# Patient Record
Sex: Male | Born: 1945 | Race: White | Hispanic: No | Marital: Married | Smoking: Former smoker
Health system: Southern US, Community
[De-identification: ages and names within clinical notes are randomized; demographics above are authoritative.]

## PROBLEM LIST (undated history)

## (undated) DIAGNOSIS — C801 Malignant (primary) neoplasm, unspecified: Secondary | ICD-10-CM

## (undated) DIAGNOSIS — Z87442 Personal history of urinary calculi: Secondary | ICD-10-CM

## (undated) DIAGNOSIS — I10 Essential (primary) hypertension: Secondary | ICD-10-CM

## (undated) DIAGNOSIS — I2510 Atherosclerotic heart disease of native coronary artery without angina pectoris: Secondary | ICD-10-CM

## (undated) DIAGNOSIS — G4730 Sleep apnea, unspecified: Secondary | ICD-10-CM

## (undated) DIAGNOSIS — E119 Type 2 diabetes mellitus without complications: Secondary | ICD-10-CM

## (undated) DIAGNOSIS — C9110 Chronic lymphocytic leukemia of B-cell type not having achieved remission: Secondary | ICD-10-CM

## (undated) DIAGNOSIS — R011 Cardiac murmur, unspecified: Secondary | ICD-10-CM

## (undated) DIAGNOSIS — K2960 Other gastritis without bleeding: Secondary | ICD-10-CM

## (undated) DIAGNOSIS — I499 Cardiac arrhythmia, unspecified: Secondary | ICD-10-CM

## (undated) DIAGNOSIS — Z8719 Personal history of other diseases of the digestive system: Secondary | ICD-10-CM

## (undated) DIAGNOSIS — K2980 Duodenitis without bleeding: Secondary | ICD-10-CM

## (undated) DIAGNOSIS — M1990 Unspecified osteoarthritis, unspecified site: Secondary | ICD-10-CM

## (undated) DIAGNOSIS — K449 Diaphragmatic hernia without obstruction or gangrene: Secondary | ICD-10-CM

## (undated) HISTORY — DX: Unspecified osteoarthritis, unspecified site: M19.90

## (undated) HISTORY — DX: Essential (primary) hypertension: I10

## (undated) HISTORY — PX: ASD REPAIR: SHX258

## (undated) HISTORY — DX: Type 2 diabetes mellitus without complications: E11.9

## (undated) HISTORY — DX: Chronic lymphocytic leukemia of B-cell type not having achieved remission: C91.10

## (undated) HISTORY — DX: Atherosclerotic heart disease of native coronary artery without angina pectoris: I25.10

## (undated) HISTORY — DX: Malignant (primary) neoplasm, unspecified: C80.1

## (undated) HISTORY — PX: HERNIA REPAIR: SHX51

## (undated) HISTORY — PX: JOINT REPLACEMENT: SHX530

## (undated) HISTORY — DX: Personal history of other diseases of the digestive system: Z87.19

## (undated) HISTORY — DX: Cardiac murmur, unspecified: R01.1

## (undated) HISTORY — PX: CARDIAC ELECTROPHYSIOLOGY STUDY AND ABLATION: SHX1294

## (undated) HISTORY — PX: CORONARY ARTERY BYPASS GRAFT: SHX141

---

## 1998-11-02 ENCOUNTER — Encounter: Payer: Self-pay | Admitting: *Deleted

## 1998-11-02 ENCOUNTER — Ambulatory Visit (HOSPITAL_COMMUNITY): Admission: EM | Admit: 1998-11-02 | Discharge: 1998-11-02 | Payer: Self-pay | Admitting: Pediatrics

## 1999-05-17 ENCOUNTER — Inpatient Hospital Stay (HOSPITAL_COMMUNITY): Admission: EM | Admit: 1999-05-17 | Discharge: 1999-05-19 | Payer: Self-pay | Admitting: Pediatrics

## 1999-05-17 ENCOUNTER — Encounter: Payer: Self-pay | Admitting: *Deleted

## 1999-05-18 ENCOUNTER — Encounter: Payer: Self-pay | Admitting: *Deleted

## 1999-05-24 ENCOUNTER — Ambulatory Visit (HOSPITAL_COMMUNITY): Admission: RE | Admit: 1999-05-24 | Discharge: 1999-05-24 | Payer: Self-pay | Admitting: *Deleted

## 1999-05-24 ENCOUNTER — Encounter: Payer: Self-pay | Admitting: *Deleted

## 2004-07-04 ENCOUNTER — Other Ambulatory Visit: Payer: Self-pay

## 2004-07-11 ENCOUNTER — Ambulatory Visit: Payer: Self-pay | Admitting: Surgery

## 2009-08-19 ENCOUNTER — Ambulatory Visit: Payer: Self-pay | Admitting: Gastroenterology

## 2010-09-19 ENCOUNTER — Ambulatory Visit: Payer: Self-pay | Admitting: Internal Medicine

## 2010-09-20 ENCOUNTER — Ambulatory Visit: Payer: Self-pay | Admitting: Internal Medicine

## 2010-10-28 ENCOUNTER — Ambulatory Visit: Payer: Self-pay | Admitting: Internal Medicine

## 2010-10-30 ENCOUNTER — Ambulatory Visit: Payer: Self-pay | Admitting: Internal Medicine

## 2010-11-29 ENCOUNTER — Ambulatory Visit: Payer: Self-pay | Admitting: Internal Medicine

## 2010-12-01 ENCOUNTER — Ambulatory Visit: Payer: Self-pay | Admitting: Psychiatry

## 2010-12-02 ENCOUNTER — Ambulatory Visit: Payer: Self-pay | Admitting: Internal Medicine

## 2011-02-23 ENCOUNTER — Ambulatory Visit: Payer: Self-pay | Admitting: Internal Medicine

## 2011-03-01 ENCOUNTER — Ambulatory Visit: Payer: Self-pay | Admitting: Internal Medicine

## 2011-06-15 ENCOUNTER — Ambulatory Visit: Payer: Self-pay | Admitting: Internal Medicine

## 2011-07-01 ENCOUNTER — Ambulatory Visit: Payer: Self-pay | Admitting: Internal Medicine

## 2011-12-22 ENCOUNTER — Ambulatory Visit: Payer: Self-pay | Admitting: Internal Medicine

## 2011-12-22 LAB — CBC CANCER CENTER
Basophil #: 0 x10 3/mm (ref 0.0–0.1)
Basophil %: 0.3 %
HGB: 13.5 g/dL (ref 13.0–18.0)
Lymphocyte #: 3.6 x10 3/mm (ref 1.0–3.6)
MCH: 29.8 pg (ref 26.0–34.0)
MCHC: 32.5 g/dL (ref 32.0–36.0)
MCV: 92 fL (ref 80–100)
Monocyte #: 0.7 x10 3/mm (ref 0.2–1.0)
Neutrophil %: 47.5 %
Platelet: 187 x10 3/mm (ref 150–440)
RDW: 14.8 % — ABNORMAL HIGH (ref 11.5–14.5)

## 2011-12-22 LAB — COMPREHENSIVE METABOLIC PANEL
Albumin: 4.4 g/dL (ref 3.4–5.0)
Alkaline Phosphatase: 70 U/L (ref 50–136)
Anion Gap: 12 (ref 7–16)
BUN: 34 mg/dL — ABNORMAL HIGH (ref 7–18)
Bilirubin,Total: 0.4 mg/dL (ref 0.2–1.0)
Calcium, Total: 9.4 mg/dL (ref 8.5–10.1)
Creatinine: 1.1 mg/dL (ref 0.60–1.30)
EGFR (African American): >60
EGFR (Non-African Amer.): >60
Potassium: 4.1 mmol/L (ref 3.5–5.1)
SGOT(AST): 21 U/L (ref 15–37)
SGPT (ALT): 19 U/L

## 2011-12-30 ENCOUNTER — Ambulatory Visit: Payer: Self-pay | Admitting: Internal Medicine

## 2012-02-15 ENCOUNTER — Emergency Department: Payer: Self-pay | Admitting: Emergency Medicine

## 2012-11-28 ENCOUNTER — Ambulatory Visit: Payer: Self-pay | Admitting: Internal Medicine

## 2012-12-29 ENCOUNTER — Ambulatory Visit: Payer: Self-pay | Admitting: Internal Medicine

## 2013-01-07 LAB — CREATININE, SERUM: EGFR (African American): >60

## 2013-01-07 LAB — CBC CANCER CENTER
Basophil #: 0 x10 3/mm (ref 0.0–0.1)
Basophil %: 0.5 %
Eosinophil %: 1.8 %
HCT: 43.9 % (ref 40.0–52.0)
HGB: 15.1 g/dL (ref 13.0–18.0)
Lymphocyte #: 4.7 x10 3/mm — ABNORMAL HIGH (ref 1.0–3.6)
Lymphocyte %: 54.7 %
MCH: 30.6 pg (ref 26.0–34.0)
MCHC: 34.4 g/dL (ref 32.0–36.0)
Monocyte #: 0.6 x10 3/mm (ref 0.2–1.0)
Neutrophil %: 35.6 %
Platelet: 191 x10 3/mm (ref 150–440)
RBC: 4.94 10*6/uL (ref 4.40–5.90)
WBC: 8.7 x10 3/mm (ref 3.8–10.6)

## 2013-01-07 LAB — LACTATE DEHYDROGENASE: LDH: 166 U/L (ref 85–241)

## 2013-01-28 ENCOUNTER — Ambulatory Visit: Payer: Self-pay | Admitting: Internal Medicine

## 2014-01-06 ENCOUNTER — Ambulatory Visit: Payer: Self-pay | Admitting: Internal Medicine

## 2014-01-06 LAB — CBC CANCER CENTER
BASOS PCT: 1.1 %
Basophil #: 0.1 x10 3/mm (ref 0.0–0.1)
Eosinophil #: 0.2 x10 3/mm (ref 0.0–0.7)
Eosinophil %: 1.7 %
HCT: 45.4 % (ref 40.0–52.0)
HGB: 14.9 g/dL (ref 13.0–18.0)
Lymphocyte #: 4.6 x10 3/mm — ABNORMAL HIGH (ref 1.0–3.6)
Lymphocyte %: 47.1 %
MCH: 29.5 pg (ref 26.0–34.0)
MCHC: 32.9 g/dL (ref 32.0–36.0)
MCV: 90 fL (ref 80–100)
Monocyte #: 0.7 x10 3/mm (ref 0.2–1.0)
Monocyte %: 7.5 %
NEUTROS ABS: 4.2 x10 3/mm (ref 1.4–6.5)
Neutrophil %: 42.6 %
Platelet: 180 x10 3/mm (ref 150–440)
RBC: 5.06 10*6/uL (ref 4.40–5.90)
RDW: 15.4 % — AB (ref 11.5–14.5)
WBC: 9.8 x10 3/mm (ref 3.8–10.6)

## 2014-01-06 LAB — CREATININE, SERUM
CREATININE: 1.33 mg/dL — AB (ref 0.60–1.30)
EGFR (African American): >60
EGFR (Non-African Amer.): 54 — ABNORMAL LOW

## 2014-01-06 LAB — LACTATE DEHYDROGENASE: LDH: 173 U/L (ref 85–241)

## 2014-01-28 ENCOUNTER — Ambulatory Visit: Payer: Self-pay | Admitting: Internal Medicine

## 2014-10-06 ENCOUNTER — Ambulatory Visit: Payer: Self-pay | Admitting: Gastroenterology

## 2014-11-18 ENCOUNTER — Ambulatory Visit: Payer: Self-pay

## 2014-11-18 ENCOUNTER — Ambulatory Visit
Admit: 2014-11-18 | Disposition: A | Discharge status: Home / Self Care | Payer: Self-pay | Attending: General Practice | Admitting: General Practice

## 2014-11-18 LAB — BASIC METABOLIC PANEL
Anion Gap: 9 (ref 7–16)
BUN: 29 mg/dL — AB
CHLORIDE: 103 mmol/L
Calcium, Total: 9.4 mg/dL
Co2: 28 mmol/L
Creatinine: 1.13 mg/dL
EGFR (African American): >60
GLUCOSE: 120 mg/dL — AB
POTASSIUM: 4 mmol/L
SODIUM: 140 mmol/L

## 2014-11-18 LAB — URINALYSIS, COMPLETE
Bacteria: NONE SEEN
Bilirubin,UR: NEGATIVE
Blood: NEGATIVE
Glucose,UR: NEGATIVE mg/dL (ref 0–75)
Ketone: NEGATIVE
Leukocyte Esterase: NEGATIVE
Nitrite: NEGATIVE
PH: 5 (ref 4.5–8.0)
PROTEIN: NEGATIVE
SPECIFIC GRAVITY: 1.021 (ref 1.003–1.030)
SQUAMOUS EPITHELIAL: NONE SEEN

## 2014-11-18 LAB — SEDIMENTATION RATE: Erythrocyte Sed Rate: 17 mm/hr (ref 0–20)

## 2014-11-18 LAB — CBC
HCT: 43.7 % (ref 40.0–52.0)
HGB: 14.5 g/dL (ref 13.0–18.0)
MCH: 30.2 pg (ref 26.0–34.0)
MCHC: 33.2 g/dL (ref 32.0–36.0)
MCV: 91 fL (ref 80–100)
Platelet: 187 10*3/uL (ref 150–440)
RBC: 4.81 10*6/uL (ref 4.40–5.90)
RDW: 14.6 % — AB (ref 11.5–14.5)
WBC: 7.7 10*3/uL (ref 3.8–10.6)

## 2014-11-18 LAB — MRSA PCR SCREENING

## 2014-11-18 LAB — HEMOGLOBIN A1C: Hemoglobin A1C: 6 %

## 2014-11-18 LAB — PROTIME-INR
INR: 1
Prothrombin Time: 13.7 secs

## 2014-11-18 LAB — APTT: Activated PTT: 35.2 secs (ref 23.6–35.9)

## 2014-11-19 LAB — URINE CULTURE

## 2014-11-23 LAB — SURGICAL PATHOLOGY

## 2014-12-01 ENCOUNTER — Other Ambulatory Visit: Payer: Medicare Other | Admitting: Orthopedic Surgery

## 2014-12-01 DIAGNOSIS — Z96659 Presence of unspecified artificial knee joint: Secondary | ICD-10-CM

## 2014-12-01 MED ORDER — ACETAMINOPHEN 10 MG/ML IV SOLN
1000.0000 mg | Freq: Four times a day (QID) | INTRAVENOUS | Status: DC
  Administered 2014-12-02: 1000 mg via INTRAVENOUS
  Filled 2014-12-01: qty 100

## 2014-12-02 ENCOUNTER — Inpatient Hospital Stay: Payer: Medicare Other

## 2014-12-02 ENCOUNTER — Encounter: Payer: Self-pay | Admitting: *Deleted

## 2014-12-02 ENCOUNTER — Encounter
Admission: RE | Disposition: A | Discharge status: Home / Self Care | Payer: Self-pay | Source: Ambulatory Visit | Attending: Orthopedic Surgery

## 2014-12-02 ENCOUNTER — Inpatient Hospital Stay: Payer: Medicare Other | Admitting: Anesthesiology

## 2014-12-02 ENCOUNTER — Other Ambulatory Visit: Payer: Self-pay | Admitting: Orthopedic Surgery

## 2014-12-02 ENCOUNTER — Inpatient Hospital Stay
Admission: RE | Admit: 2014-12-02 | Discharge: 2014-12-05 | DRG: 470 | Disposition: A | Discharge status: Home / Self Care | Payer: Medicare Other | Source: Ambulatory Visit | Attending: Orthopedic Surgery | Admitting: Orthopedic Surgery

## 2014-12-02 DIAGNOSIS — Z7982 Long term (current) use of aspirin: Secondary | ICD-10-CM | POA: Diagnosis not present

## 2014-12-02 DIAGNOSIS — Z87891 Personal history of nicotine dependence: Secondary | ICD-10-CM | POA: Diagnosis not present

## 2014-12-02 DIAGNOSIS — Z96652 Presence of left artificial knee joint: Secondary | ICD-10-CM

## 2014-12-02 DIAGNOSIS — I10 Essential (primary) hypertension: Secondary | ICD-10-CM | POA: Diagnosis present

## 2014-12-02 DIAGNOSIS — I4891 Unspecified atrial fibrillation: Secondary | ICD-10-CM | POA: Diagnosis present

## 2014-12-02 DIAGNOSIS — Z79899 Other long term (current) drug therapy: Secondary | ICD-10-CM | POA: Diagnosis not present

## 2014-12-02 DIAGNOSIS — K5790 Diverticulosis of intestine, part unspecified, without perforation or abscess without bleeding: Secondary | ICD-10-CM | POA: Diagnosis present

## 2014-12-02 DIAGNOSIS — Z96659 Presence of unspecified artificial knee joint: Secondary | ICD-10-CM

## 2014-12-02 DIAGNOSIS — Z86010 Personal history of colonic polyps: Secondary | ICD-10-CM | POA: Diagnosis not present

## 2014-12-02 DIAGNOSIS — Z800 Family history of malignant neoplasm of digestive organs: Secondary | ICD-10-CM

## 2014-12-02 DIAGNOSIS — Z856 Personal history of leukemia: Secondary | ICD-10-CM | POA: Diagnosis not present

## 2014-12-02 DIAGNOSIS — Z8249 Family history of ischemic heart disease and other diseases of the circulatory system: Secondary | ICD-10-CM | POA: Diagnosis not present

## 2014-12-02 DIAGNOSIS — Z87442 Personal history of urinary calculi: Secondary | ICD-10-CM | POA: Diagnosis not present

## 2014-12-02 DIAGNOSIS — M109 Gout, unspecified: Secondary | ICD-10-CM | POA: Diagnosis present

## 2014-12-02 DIAGNOSIS — I2510 Atherosclerotic heart disease of native coronary artery without angina pectoris: Secondary | ICD-10-CM | POA: Diagnosis present

## 2014-12-02 DIAGNOSIS — E119 Type 2 diabetes mellitus without complications: Secondary | ICD-10-CM | POA: Diagnosis present

## 2014-12-02 DIAGNOSIS — K449 Diaphragmatic hernia without obstruction or gangrene: Secondary | ICD-10-CM | POA: Diagnosis not present

## 2014-12-02 DIAGNOSIS — M1712 Unilateral primary osteoarthritis, left knee: Principal | ICD-10-CM | POA: Diagnosis present

## 2014-12-02 DIAGNOSIS — R011 Cardiac murmur, unspecified: Secondary | ICD-10-CM | POA: Diagnosis not present

## 2014-12-02 DIAGNOSIS — K2960 Other gastritis without bleeding: Secondary | ICD-10-CM | POA: Diagnosis not present

## 2014-12-02 HISTORY — PX: TOTAL KNEE ARTHROPLASTY: SHX125

## 2014-12-02 LAB — BASIC METABOLIC PANEL
Anion gap: 5 (ref 5–15)
BUN: 22 mg/dL — AB (ref 6–20)
CHLORIDE: 109 mmol/L (ref 101–111)
CO2: 25 mmol/L (ref 22–32)
Calcium: 8.1 mg/dL — ABNORMAL LOW (ref 8.9–10.3)
Creatinine, Ser: 0.96 mg/dL (ref 0.61–1.24)
GFR calc Af Amer: >60 mL/min (ref >60)
GLUCOSE: 122 mg/dL — AB (ref 65–99)
Potassium: 4 mmol/L (ref 3.5–5.1)
SODIUM: 139 mmol/L (ref 135–145)

## 2014-12-02 LAB — CBC
HCT: 36.2 % — ABNORMAL LOW (ref 40.0–52.0)
Hemoglobin: 12 g/dL — ABNORMAL LOW (ref 13.0–18.0)
MCH: 30.6 pg (ref 26.0–34.0)
MCHC: 33.2 g/dL (ref 32.0–36.0)
MCV: 92.1 fL (ref 80.0–100.0)
Platelets: 128 10*3/uL — ABNORMAL LOW (ref 150–440)
RBC: 3.94 MIL/uL — ABNORMAL LOW (ref 4.40–5.90)
RDW: 15.1 % — ABNORMAL HIGH (ref 11.5–14.5)
WBC: 8.6 10*3/uL (ref 3.8–10.6)

## 2014-12-02 LAB — GLUCOSE, CAPILLARY
GLUCOSE-CAPILLARY: 106 mg/dL — AB (ref 70–99)
Glucose-Capillary: 114 mg/dL — ABNORMAL HIGH (ref 70–99)
Glucose-Capillary: 186 mg/dL — ABNORMAL HIGH (ref 70–99)

## 2014-12-02 LAB — HEMOGLOBIN A1C: Hgb A1c MFr Bld: 6.1 % — ABNORMAL HIGH (ref 4.0–6.0)

## 2014-12-02 SURGERY — ARTHROPLASTY, KNEE, TOTAL
Anesthesia: Spinal | Laterality: Left

## 2014-12-02 MED ORDER — BISACODYL 10 MG RE SUPP
10.0000 mg | Freq: Every day | RECTAL | Status: DC | PRN
  Administered 2014-12-04: 10 mg via RECTAL
  Filled 2014-12-02: qty 1

## 2014-12-02 MED ORDER — OXYCODONE HCL 5 MG PO TABS
5.0000 mg | ORAL_TABLET | ORAL | Status: DC | PRN
  Administered 2014-12-02 – 2014-12-05 (×13): 10 mg via ORAL
  Filled 2014-12-02 (×13): qty 2

## 2014-12-02 MED ORDER — ACETAMINOPHEN 10 MG/ML IV SOLN
1000.0000 mg | Freq: Four times a day (QID) | INTRAVENOUS | Status: AC
  Administered 2014-12-02 – 2014-12-03 (×4): 1000 mg via INTRAVENOUS
  Filled 2014-12-02 (×4): qty 100

## 2014-12-02 MED ORDER — ALLOPURINOL 100 MG PO TABS
100.0000 mg | ORAL_TABLET | Freq: Every day | ORAL | Status: DC
  Administered 2014-12-03 – 2014-12-05 (×3): 100 mg via ORAL
  Filled 2014-12-02 (×4): qty 1

## 2014-12-02 MED ORDER — ACETAMINOPHEN 325 MG PO TABS: 650.0000 mg | ORAL_TABLET | Freq: Four times a day (QID) | ORAL | Status: DC | PRN

## 2014-12-02 MED ORDER — ACETAMINOPHEN 650 MG RE SUPP: 650.0000 mg | Freq: Four times a day (QID) | RECTAL | Status: DC | PRN

## 2014-12-02 MED ORDER — CEFAZOLIN SODIUM-DEXTROSE 2-3 GM-% IV SOLR: 2.0000 g | INTRAVENOUS | Status: DC

## 2014-12-02 MED ORDER — METOCLOPRAMIDE HCL 10 MG PO TABS
10.0000 mg | ORAL_TABLET | Freq: Three times a day (TID) | ORAL | Status: DC
  Administered 2014-12-02 – 2014-12-05 (×11): 10 mg via ORAL
  Filled 2014-12-02 (×11): qty 1

## 2014-12-02 MED ORDER — PANTOPRAZOLE SODIUM 40 MG PO TBEC
40.0000 mg | DELAYED_RELEASE_TABLET | Freq: Two times a day (BID) | ORAL | Status: DC
  Administered 2014-12-02 – 2014-12-05 (×7): 40 mg via ORAL
  Filled 2014-12-02 (×7): qty 1

## 2014-12-02 MED ORDER — GLYCOPYRROLATE 0.2 MG/ML IJ SOLN
INTRAMUSCULAR | Status: AC
  Filled 2014-12-02: qty 1

## 2014-12-02 MED ORDER — MAGNESIUM HYDROXIDE 400 MG/5ML PO SUSP
30.0000 mL | Freq: Every day | ORAL | Status: DC | PRN
  Administered 2014-12-04 – 2014-12-05 (×2): 30 mL via ORAL
  Filled 2014-12-02 (×3): qty 30

## 2014-12-02 MED ORDER — CEFAZOLIN SODIUM-DEXTROSE 2-3 GM-% IV SOLR
INTRAVENOUS | Status: AC
  Administered 2014-12-02: 2 g via INTRAVENOUS
  Filled 2014-12-02: qty 50

## 2014-12-02 MED ORDER — FENTANYL CITRATE (PF) 100 MCG/2ML IJ SOLN
INTRAMUSCULAR | Status: DC | PRN
  Administered 2014-12-02 (×2): 50 ug via INTRAVENOUS

## 2014-12-02 MED ORDER — FENTANYL CITRATE (PF) 100 MCG/2ML IJ SOLN
INTRAMUSCULAR | Status: AC
  Filled 2014-12-02: qty 2

## 2014-12-02 MED ORDER — FAMOTIDINE 20 MG PO TABS
20.0000 mg | ORAL_TABLET | Freq: Once | ORAL | Status: AC
  Administered 2014-12-02: 20 mg via ORAL

## 2014-12-02 MED ORDER — VANCOMYCIN HCL IN DEXTROSE 1-5 GM/200ML-% IV SOLN
1000.0000 mg | INTRAVENOUS | Status: AC
  Administered 2014-12-02: 1000 mg via INTRAVENOUS
  Filled 2014-12-02: qty 200

## 2014-12-02 MED ORDER — SENNA 8.6 MG PO TABS
1.0000 | ORAL_TABLET | Freq: Two times a day (BID) | ORAL | Status: DC
  Administered 2014-12-02 – 2014-12-04 (×5): 8.6 mg via ORAL
  Filled 2014-12-02 (×5): qty 1

## 2014-12-02 MED ORDER — FERROUS SULFATE 325 (65 FE) MG PO TABS
325.0000 mg | ORAL_TABLET | Freq: Two times a day (BID) | ORAL | Status: DC
  Administered 2014-12-03 – 2014-12-05 (×5): 325 mg via ORAL
  Filled 2014-12-02 (×6): qty 1

## 2014-12-02 MED ORDER — FENTANYL CITRATE (PF) 100 MCG/2ML IJ SOLN
25.0000 ug | INTRAMUSCULAR | Status: DC | PRN
  Administered 2014-12-02: 25 ug via INTRAVENOUS

## 2014-12-02 MED ORDER — ONDANSETRON HCL 4 MG/2ML IJ SOLN
4.0000 mg | Freq: Four times a day (QID) | INTRAMUSCULAR | Status: DC | PRN
  Administered 2014-12-03: 4 mg via INTRAVENOUS
  Filled 2014-12-02 (×2): qty 2

## 2014-12-02 MED ORDER — ONDANSETRON HCL 4 MG/2ML IJ SOLN: 4.0000 mg | Freq: Once | INTRAMUSCULAR | Status: DC | PRN

## 2014-12-02 MED ORDER — EPHEDRINE SULFATE 50 MG/ML IJ SOLN
INTRAMUSCULAR | Status: DC | PRN
  Administered 2014-12-02: 10 mg via INTRAVENOUS

## 2014-12-02 MED ORDER — FLEET ENEMA 7-19 GM/118ML RE ENEM: 1.0000 | ENEMA | Freq: Once | RECTAL | Status: AC | PRN

## 2014-12-02 MED ORDER — BUPIVACAINE HCL (PF) 0.5 % IJ SOLN
INTRAMUSCULAR | Status: DC | PRN
  Administered 2014-12-02: 3 mL

## 2014-12-02 MED ORDER — PROPOFOL INFUSION 10 MG/ML OPTIME
INTRAVENOUS | Status: DC | PRN
  Administered 2014-12-02: 100 ug/kg/min via INTRAVENOUS

## 2014-12-02 MED ORDER — BUPIVACAINE-EPINEPHRINE (PF) 0.25% -1:200000 IJ SOLN
INTRAMUSCULAR | Status: AC
  Filled 2014-12-02: qty 30

## 2014-12-02 MED ORDER — METFORMIN HCL 500 MG PO TABS
1000.0000 mg | ORAL_TABLET | Freq: Two times a day (BID) | ORAL | Status: DC
  Administered 2014-12-03: 1000 mg via ORAL
  Filled 2014-12-02 (×2): qty 2

## 2014-12-02 MED ORDER — BUPIVACAINE-EPINEPHRINE (PF) 0.5% -1:200000 IJ SOLN
INTRAMUSCULAR | Status: DC | PRN
  Administered 2014-12-02: 30 mL

## 2014-12-02 MED ORDER — MORPHINE SULFATE 2 MG/ML IJ SOLN
2.0000 mg | INTRAMUSCULAR | Status: DC | PRN
  Administered 2014-12-02: 2 mg via INTRAVENOUS
  Filled 2014-12-02: qty 1

## 2014-12-02 MED ORDER — PNEUMOCOCCAL VAC POLYVALENT 25 MCG/0.5ML IJ INJ
0.5000 mL | INJECTION | INTRAMUSCULAR | Status: AC
  Administered 2014-12-03: 0.5 mL via INTRAMUSCULAR
  Filled 2014-12-02: qty 0.5

## 2014-12-02 MED ORDER — SODIUM CHLORIDE 0.9 % IJ SOLN
INTRAMUSCULAR | Status: AC
  Filled 2014-12-02: qty 50

## 2014-12-02 MED ORDER — GLYCOPYRROLATE 0.2 MG/ML IJ SOLN
0.2000 mg | Freq: Once | INTRAMUSCULAR | Status: AC
  Administered 2014-12-02: 0.2 mg via INTRAVENOUS

## 2014-12-02 MED ORDER — ALUM & MAG HYDROXIDE-SIMETH 200-200-20 MG/5ML PO SUSP: 30.0000 mL | ORAL | Status: DC | PRN

## 2014-12-02 MED ORDER — SODIUM CHLORIDE 0.9 % IV SOLN
100.0000 mL | INTRAVENOUS | Status: DC
  Administered 2014-12-02: 100 mL via INTRAVENOUS
  Administered 2014-12-03: 1000 mL via INTRAVENOUS

## 2014-12-02 MED ORDER — MENTHOL 3 MG MT LOZG: 1.0000 | LOZENGE | OROMUCOSAL | Status: DC | PRN

## 2014-12-02 MED ORDER — ONDANSETRON HCL 4 MG PO TABS
4.0000 mg | ORAL_TABLET | Freq: Four times a day (QID) | ORAL | Status: DC | PRN
  Administered 2014-12-03 – 2014-12-04 (×3): 4 mg via ORAL
  Filled 2014-12-02 (×3): qty 1

## 2014-12-02 MED ORDER — SODIUM CHLORIDE 0.9 % IV SOLN
INTRAVENOUS | Status: DC
  Administered 2014-12-02 (×4): via INTRAVENOUS

## 2014-12-02 MED ORDER — INSULIN ASPART 100 UNIT/ML ~~LOC~~ SOLN
0.0000 [IU] | Freq: Three times a day (TID) | SUBCUTANEOUS | Status: DC
  Administered 2014-12-03: 3 [IU] via SUBCUTANEOUS
  Filled 2014-12-02 (×2): qty 3

## 2014-12-02 MED ORDER — ACETAMINOPHEN 10 MG/ML IV SOLN
INTRAVENOUS | Status: AC
  Filled 2014-12-02: qty 100

## 2014-12-02 MED ORDER — ENOXAPARIN SODIUM 30 MG/0.3ML ~~LOC~~ SOLN
30.0000 mg | Freq: Two times a day (BID) | SUBCUTANEOUS | Status: DC
  Administered 2014-12-03 – 2014-12-05 (×5): 30 mg via SUBCUTANEOUS
  Filled 2014-12-02 (×6): qty 0.3

## 2014-12-02 MED ORDER — GEMFIBROZIL 600 MG PO TABS
600.0000 mg | ORAL_TABLET | Freq: Two times a day (BID) | ORAL | Status: DC
  Administered 2014-12-03: 600 mg via ORAL
  Filled 2014-12-02 (×3): qty 1

## 2014-12-02 MED ORDER — CEFAZOLIN SODIUM-DEXTROSE 2-3 GM-% IV SOLR
2.0000 g | Freq: Four times a day (QID) | INTRAVENOUS | Status: AC
  Administered 2014-12-02 – 2014-12-03 (×4): 2 g via INTRAVENOUS
  Filled 2014-12-02 (×4): qty 50

## 2014-12-02 MED ORDER — CELECOXIB 200 MG PO CAPS
200.0000 mg | ORAL_CAPSULE | Freq: Two times a day (BID) | ORAL | Status: DC
  Administered 2014-12-02 – 2014-12-05 (×6): 200 mg via ORAL
  Filled 2014-12-02 (×6): qty 1

## 2014-12-02 MED ORDER — PHENOL 1.4 % MT LIQD: 1.0000 | OROMUCOSAL | Status: DC | PRN

## 2014-12-02 MED ORDER — SODIUM CHLORIDE 0.9 % IV SOLN
INTRAVENOUS | Status: DC | PRN
  Administered 2014-12-02: 60 mL

## 2014-12-02 MED ORDER — NEOMYCIN-POLYMYXIN B GU 40-200000 IR SOLN
Status: DC | PRN
  Administered 2014-12-02: 16 mL

## 2014-12-02 MED ORDER — FAMOTIDINE 20 MG PO TABS
ORAL_TABLET | ORAL | Status: AC
  Filled 2014-12-02: qty 1

## 2014-12-02 MED ORDER — HYDROCHLOROTHIAZIDE 25 MG PO TABS
25.0000 mg | ORAL_TABLET | Freq: Every day | ORAL | Status: DC
  Administered 2014-12-03 – 2014-12-05 (×3): 25 mg via ORAL
  Filled 2014-12-02 (×4): qty 1

## 2014-12-02 MED ORDER — MIDAZOLAM HCL 2 MG/2ML IJ SOLN
INTRAMUSCULAR | Status: DC | PRN
  Administered 2014-12-02: 1 mg via INTRAVENOUS

## 2014-12-02 MED ORDER — METOPROLOL SUCCINATE ER 50 MG PO TB24
50.0000 mg | ORAL_TABLET | Freq: Every day | ORAL | Status: DC
  Administered 2014-12-03 – 2014-12-05 (×3): 50 mg via ORAL
  Filled 2014-12-02 (×3): qty 1

## 2014-12-02 MED ORDER — CHLORHEXIDINE GLUCONATE 4 % EX LIQD: 60.0000 mL | Freq: Once | CUTANEOUS | Status: DC

## 2014-12-02 SURGICAL SUPPLY — 56 items
AUTOTRANSFUS HAS 1/8 (MISCELLANEOUS) ×2
BATTERY INSTRU NAVIGATION (MISCELLANEOUS) ×8 IMPLANT
BLADE SAW 1 (BLADE) ×2 IMPLANT
BLADE SAW 1/2 (BLADE) ×2 IMPLANT
BONE CEMENT GENTAMICIN (Cement) ×4 IMPLANT
CANISTER SUCT 1200ML W/VALVE (MISCELLANEOUS) ×2 IMPLANT
CANISTER SUCT 3000ML (MISCELLANEOUS) ×4 IMPLANT
CAP KNEE TOTAL 3 SIGMA ×2 IMPLANT
CATH TRAY 16F METER LATEX (MISCELLANEOUS) ×2 IMPLANT
CEMENT BONE GENTAMICIN 40 (Cement) ×2 IMPLANT
COOLER POLAR GLACIER W/PUMP (MISCELLANEOUS) ×2 IMPLANT
DRAPE SHEET LG 3/4 BI-LAMINATE (DRAPES) ×2 IMPLANT
DRSG DERMACEA 8X12 NADH (GAUZE/BANDAGES/DRESSINGS) ×2 IMPLANT
DRSG OPSITE POSTOP 4X12 (GAUZE/BANDAGES/DRESSINGS) ×2 IMPLANT
DRSG OPSITE POSTOP 4X14 (GAUZE/BANDAGES/DRESSINGS) ×2 IMPLANT
DURAPREP 26ML APPLICATOR (WOUND CARE) ×4 IMPLANT
EX-PIN ORTHOLOCK NAV 4X150 (PIN) ×4 IMPLANT
GLOVE BIOGEL M STRL SZ7.5 (GLOVE) ×4 IMPLANT
GLOVE INDICATOR 8.0 STRL GRN (GLOVE) ×2 IMPLANT
GLOVE SURG 9.0 ORTHO LTXF (GLOVE) ×2 IMPLANT
GLOVE SURG ORTHO 9.0 STRL STRW (GLOVE) ×2 IMPLANT
GOWN STRL REUS W/ TWL LRG LVL3 (GOWN DISPOSABLE) ×1 IMPLANT
GOWN STRL REUS W/TWL 2XL LVL3 (GOWN DISPOSABLE) ×2 IMPLANT
GOWN STRL REUS W/TWL LRG LVL3 (GOWN DISPOSABLE) ×1
GOWN STRL REUS W/TWL XL LVL4 (GOWN DISPOSABLE) ×2 IMPLANT
HANDPIECE SUCTION TUBG SURGILV (MISCELLANEOUS) ×2 IMPLANT
HOLDER FOLEY CATH W/STRAP (MISCELLANEOUS) ×2 IMPLANT
HOOD PEEL AWAY FACE SHEILD DIS (HOOD) ×4 IMPLANT
KNIFE SCULPS 14X20 (INSTRUMENTS) ×2 IMPLANT
NDL SAFETY 18GX1.5 (NEEDLE) ×2 IMPLANT
NEEDLE SPNL 18GX3.5 QUINCKE PK (NEEDLE) ×2 IMPLANT
NEEDLE SPNL 20GX3.5 QUINCKE YW (NEEDLE) ×2 IMPLANT
NS IRRIG 500ML POUR BTL (IV SOLUTION) ×2 IMPLANT
PACK TOTAL KNEE (MISCELLANEOUS) ×2 IMPLANT
PAD GROUND ADULT SPLIT (MISCELLANEOUS) ×2 IMPLANT
PAD WRAPON POLAR KNEE (MISCELLANEOUS) ×1 IMPLANT
PIN FIXATION 1/8DIA X 3INL (PIN) ×2 IMPLANT
SOL .9 NS 3000ML IRR  AL (IV SOLUTION) ×1
SOL .9 NS 3000ML IRR UROMATIC (IV SOLUTION) ×1 IMPLANT
SOL PREP PVP 2OZ (MISCELLANEOUS) ×2
SOLUTION PREP PVP 2OZ (MISCELLANEOUS) ×1 IMPLANT
SPONGE DRAIN TRACH 4X4 STRL 2S (GAUZE/BANDAGES/DRESSINGS) ×2 IMPLANT
STAPLER SKIN PROX 35W (STAPLE) ×2 IMPLANT
STRAP SAFETY BODY (MISCELLANEOUS) ×2 IMPLANT
SUCTION FRAZIER TIP 10 FR DISP (SUCTIONS) ×2 IMPLANT
SUT VIC AB 0 CT1 36 (SUTURE) ×2 IMPLANT
SUT VIC AB 1 CT1 36 (SUTURE) ×4 IMPLANT
SUT VIC AB 2-0 CT2 27 (SUTURE) ×2 IMPLANT
SYR 20CC LL (SYRINGE) ×2 IMPLANT
SYR 30ML LL (SYRINGE) ×2 IMPLANT
SYR 50ML LL SCALE MARK (SYRINGE) ×2 IMPLANT
SYSTEM AUTOTRANSFUS DUAL TROCR (MISCELLANEOUS) ×1 IMPLANT
TOWEL OR 17X26 4PK STRL BLUE (TOWEL DISPOSABLE) ×2 IMPLANT
TOWER CARTRIDGE SMART MIX (DISPOSABLE) ×2 IMPLANT
WATER STERILE IRR 1000ML POUR (IV SOLUTION) ×2 IMPLANT
WRAPON POLAR PAD KNEE (MISCELLANEOUS) ×2

## 2014-12-02 NOTE — Brief Op Note (Signed)
12/02/2014  11:01 AM  PATIENT:  Diona Foley  69 y.o. male  PRE-OPERATIVE DIAGNOSIS:  Degenerative arthrosis of knee-primary  POST-OPERATIVE DIAGNOSIS:  Degenerative arthrosis of knee  PROCEDURE:  Procedure(s): TOTAL KNEE ARTHROPLASTY- computer assisted (Left)  SURGEON:  Surgeon(s) and Role:    * Dereck Leep, MD - Primary  PHYSICIAN ASSISTANT: Vance Peper, PA   ANESTHESIA:   spinal  EBL:  Total I/O In: 2000 [I.V.:2000] Out: 225 [Urine:200; Blood:25]  BLOOD ADMINISTERED:none  DRAINS: 2 medium drains to reinfusion system   LOCAL MEDICATIONS USED:  MARCAINE with epi   and Amount: 30 ml  SPECIMEN:  No Specimen  DISPOSITION OF SPECIMEN:  N/A  COUNTS:  YES  TOURNIQUET:   Total Tourniquet Time Documented: Thigh (Left) - 83 minutes Total: Thigh (Left) - 83 minutes   DICTATION: .Viviann Spare Dictation  PLAN OF CARE: Admit to inpatient   PATIENT DISPOSITION:  PACU - hemodynamically stable.   Delay start of Pharmacological VTE agent (>24hrs) due to surgical blood loss or risk of bleeding: yes

## 2014-12-02 NOTE — Op Note (Signed)
DATE OF SURGERY:  12/02/2014  PATIENT NAME:  Colin Gutierrez   DOB: 1946-01-20  MRN: [id number]  PRE-OPERATIVE DIAGNOSIS: Degenerative arthrosis of the left knee, primary  POST-OPERATIVE DIAGNOSIS:  Same  PROCEDURE:  Left total knee arthroplasty using computer-assisted navigation  SURGEON:  Marciano Sequin. M.D.  ASSISTANT:  Vance Peper, PA (present and scrubbed throughout the case, critical for assistance with exposure, retraction, instrumentation, and closure)  ANESTHESIA: Spinal   ESTIMATED BLOOD LOSS: 25 mL  FLUIDS REPLACED: 2000 mL of crystalloid  TOURNIQUET TIME: 83 minutes  DRAINS: 2 medium drains to a reinfusion system  SOFT TISSUE RELEASES: Anterior cruciate ligament, posterior cruciate ligament, deep and superficial medial collateral ligament, patellofemoral ligament   IMPLANTS UTILIZED: DePuy PFC Sigma size 3 posterior stabilized femoral component (cemented), size 4 MBT tibial component (cemented), 38 mm 3 peg oval dome patella (cemented), and a 15 mm stabilized rotating platform polyethylene insert.  INDICATIONS FOR SURGERY: Colin Gutierrez is a 69 y.o. year old male with a long history of progressive knee pain. X-rays demonstrated severe degenerative changes in tricompartmental fashion. He had not seen any significant improvement despite conservative nonsurgical intervention. After discussion of the risks and benefits of surgical intervention, the patient expressed understanding of the risks benefits and agree with plans for total knee arthroplasty.   The risks, benefits, and alternatives were discussed at length including but not limited to the risks of infection, bleeding, nerve injury, stiffness, blood clots, the need for revision surgery, cardiopulmonary complications, among others, and they were willing to proceed.  PROCEDURE IN DETAIL: The patient was brought into the operating room and, after adequate spinal anesthesia was achieved, a tourniquet was placed on the  patient's upper thigh. The patient's knee and leg were cleaned and prepped with alcohol and DuraPrep and draped in the usual sterile fashion. A "timeout" was performed as per usual protocol. The lower extremity was exsanguinated using an Esmarch, and the tourniquet was inflated to 300 mmHg. An anterior longitudinal incision was made followed by a standard mid vastus approach. The deep fibers of the medial collateral ligament were elevated in a subperiosteal fashion off of the medial flare of the tibia so as to maintain a continuous soft tissue sleeve. The patella was subluxed laterally and the patellofemoral ligament was incised. Inspection of the knee demonstrated severe degenerative changes with full-thickness loss of articular cartilage. Osteophytes were debrided using a rongeur. Anterior and posterior cruciate ligaments were excised. Two 4.0 mm Schanz pins were inserted in the femur and into the tibia for attachment of the array of trackers used for computer-assisted navigation. Hip center was identified using a circumduction technique. Distal landmarks were mapped using the computer. The distal femur and proximal tibia were mapped using the computer. The distal femoral cutting guide was positioned using computer-assisted navigation so as to achieve a 5 distal valgus cut. The femur was sized and it was felt that a size 3 femoral component was appropriate. A size 3 femoral cutting guide was positioned and the anterior cut was performed and verified using the computer. This was followed by completion of the posterior and chamfer cuts. Femoral cutting guide for the central box was then positioned in the center box cut was performed.  Attention was then directed to the proximal tibia. Medial and lateral menisci were excised. The extramedullary tibial cutting guide was positioned using computer-assisted navigation so as to achieve a 0 varus-valgus alignment and 0 posterior slope. The cut was performed and  verified using  the computer. The proximal tibia was sized and it was felt that a size 4 tibial tray was appropriate. Tibial and femoral trials were inserted followed by insertion of a 12.5 mm polyethylene insert. The knee was felt to be tight medially. A Cobb elevator was used to elevate the superficial fibers of the medial collateral ligament and a 15 mm polyethylene was inserted.This allowed for excellent mediolateral soft tissue balancing both in flexion and in full extension. Finally, the patella was cut and repaired so as to accommodate a 38 mm 3 peg oval dome patella. A patella trial was placed and the knee was placed through a range of motion with excellent patellar tracking appreciated. The femoral trial was removed after debridement of posterior osteophytes. The central post-hole for the tibial component was reamed followed by insertion of a keel punch. Tibial trials were then removed. Cut surfaces of bone were irrigated with copious amounts of normal saline with antibiotic solution using pulsatile lavage and then suctioned dry. Polymethylmethacrylate cement was prepared in the usual fashion using a vacuum mixer. Cement was applied to the cut surface of the proximal tibia as well as along the undersurface of a size 4 MBT tibial component. Tibial component was positioned and impacted into place. Excess cement was removed using [employer] fast streamer. Cement was then applied to the cut surfaces of the femur as well as along the posterior flanges of the size 3 femoral component the femoral component was positioned and impacted in place. Excess cement was removed using [employer] fast streamer. A 15 mm polyethylene trial was inserted and the knee was brought into full extension with steady axial compression applied. Finally, cement was applied to the backside of a 38 mm 3 peg oval dome patella and the patellar component was positioned and patellar clamp applied. Excess cement was removed using [employer] fast streamer. After adequate  curing of the cement, the tourniquet was deflated after a total tourniquet time of 83 minutes. Hemostasis was achieved using electrocautery. The knee was irrigated with copious amounts of normal saline with antibiotic solution using pulsatile lavage and then suctioned dry. 20 mL of 1.3% Exparel in 40 mL of normal saline was injected along the posterior capsule, medial and lateral gutters, and along the arthrotomy site. A 15 mm stabilized rotating platform polyethylene insert was inserted and the knee was placed through a range of motion with excellent mediolateral soft tissue balancing appreciated and excellent patellar tracking noted. 2 medium drains were placed in the wound bed and brought out through separate stab incisions to be attached to a reinfusion system. The medial parapatellar portion of the incision was reapproximated using interrupted sutures of #1 Vicryl. Subcutaneous tissue was then injected with a total of 30 cc of 0.25% Marcaine with epinephrine. Subcutaneous tissue was approximated in layers using first #0 Vicryl followed #2-0 Vicryl. The skin was approximated with skin staples. A sterile dressing was applied.  The patient tolerated the procedure well and was transported to the recovery room in stable condition.    Rana Adorno P. Holley Bouche., M.D.

## 2014-12-02 NOTE — Anesthesia Postprocedure Evaluation (Signed)
  Anesthesia Post-op Note  Patient: Colin Gutierrez  Procedure(s) Performed: Procedure(s): TOTAL KNEE ARTHROPLASTY- computer assisted (Left)  Anesthesia type:Spinal  Patient location: PACU  Post pain: Pain level controlled  Post assessment: Post-op Vital signs reviewed, Patient's Cardiovascular Status Stable, Respiratory Function Stable, Patent Airway and No signs of Nausea or vomiting  Post vital signs: Reviewed and stable  Last Vitals:  Filed Vitals:   12/02/14 1452  BP:   Pulse: 53  Temp:   Resp:     Level of consciousness: awake, alert  and patient cooperative  Complications: No apparent anesthesia complications

## 2014-12-02 NOTE — Transfer of Care (Signed)
Immediate Anesthesia Transfer of Care Note  Patient: Colin Gutierrez  Procedure(s) Performed: Procedure(s): TOTAL KNEE ARTHROPLASTY- computer assisted (Left)  Patient Location: PACU  Anesthesia Type:Spinal  Level of Consciousness: awake and alert   Airway & Oxygen Therapy: Patient Spontanous Breathing  Post-op Assessment: Report given to RN  Post vital signs: Reviewed and stable  Last Vitals:  Filed Vitals:   12/02/14 1103  BP: 104/64  Pulse:   Temp: 36.3 C  Resp: 10    Complications: No apparent anesthesia complications

## 2014-12-02 NOTE — Anesthesia Preprocedure Evaluation (Addendum)
Anesthesia Evaluation  Patient identified by MRN, date of birth, ID band Patient awake    Airway Mallampati: II  TM Distance: >3 FB Neck ROM: full    Dental  (+) Chipped,    Pulmonary former smoker,          Cardiovascular hypertension,     Neuro/Psych    GI/Hepatic hiatal hernia,   Endo/Other  diabetes  Renal/GU      Musculoskeletal   Abdominal   Peds  Hematology   Anesthesia Other Findings   Reproductive/Obstetrics                           Anesthesia Physical Anesthesia Plan  ASA: II  Anesthesia Plan: Spinal   Post-op Pain Management:    Induction:   Airway Management Planned: Nasal Cannula  Additional Equipment:   Intra-op Plan:   Post-operative Plan:   Informed Consent: I have reviewed the patients History and Physical, chart, labs and discussed the procedure including the risks, benefits and alternatives for the proposed anesthesia with the patient or authorized representative who has indicated his/her understanding and acceptance.     Plan Discussed with: CRNA and Surgeon  Anesthesia Plan Comments:         Anesthesia Quick Evaluation

## 2014-12-02 NOTE — Anesthesia Procedure Notes (Signed)
Spinal Patient location during procedure: OR Start time: 12/02/2014 7:45 AM End time: 12/02/2014 7:51 AM Reason for block: at surgeon's request Staffing Anesthesiologist: Gunnar Bulla Resident/CRNA: Binyamin Nelis, [place] Performed by: anesthesiologist and resident/CRNA  Preanesthetic Checklist Completed: patient identified, site marked, surgical consent, pre-op evaluation, timeout performed, IV checked, risks and benefits discussed, monitors and equipment checked and at surgeon's request Spinal Block Patient position: sitting Prep: Betadine Patient monitoring: heart rate, cardiac monitor, continuous pulse ox and blood pressure Approach: midline Location: L3-4 Injection technique: single-shot Needle Needle type: Whitacre  Needle gauge: 24 G Needle length: 9 cm Needle insertion depth: 6 cm Assessment Sensory level: T10

## 2014-12-02 NOTE — H&P (Signed)
The patient has been re-examined, and the chart reviewed, and there have been no interval changes to the documented history and physical.    The risks, benefits, and alternatives have been discussed at length, and the patient is willing to proceed.   

## 2014-12-02 NOTE — Progress Notes (Signed)
Pt reports that he is having right sided/substernal chest pain. Unable to describe the pain, pain in not reproducible with palpation or a deep breath. Pain does not radiate. No nausea, SOB. Dr. Marcello Moores called, came to bedside to see pt. Heart rate in 40s, has been in 50s and high 40s since arriving to PACU and in and out of a-fib. Has hx of a-fib. BP 105/72. Order received for 0.2 mf of robinul.

## 2014-12-02 NOTE — Outcomes Assessment (Signed)
Pt arrived to floor via stretcher s/p left total knee. Spinal started off at L3 shortly after full sensation noted. reinfused 450 ml of blood. Oxy for pain, alert and oriented, isolation for positive nasal swap. Pulse runs low otherwise vitals are normal.

## 2014-12-03 DIAGNOSIS — M1712 Unilateral primary osteoarthritis, left knee: Secondary | ICD-10-CM | POA: Diagnosis not present

## 2014-12-03 LAB — GLUCOSE, CAPILLARY
GLUCOSE-CAPILLARY: 127 mg/dL — AB (ref 70–99)
GLUCOSE-CAPILLARY: 105 mg/dL — AB (ref 70–99)
GLUCOSE-CAPILLARY: 113 mg/dL — AB (ref 70–99)
Glucose-Capillary: 158 mg/dL — ABNORMAL HIGH (ref 70–99)

## 2014-12-03 LAB — CBC
HCT: 32.9 % — ABNORMAL LOW (ref 40.0–52.0)
Hemoglobin: 11.1 g/dL — ABNORMAL LOW (ref 13.0–18.0)
MCH: 31 pg (ref 26.0–34.0)
MCHC: 33.7 g/dL (ref 32.0–36.0)
MCV: 92.2 fL (ref 80.0–100.0)
Platelets: 106 10*3/uL — ABNORMAL LOW (ref 150–440)
RBC: 3.57 MIL/uL — AB (ref 4.40–5.90)
RDW: 15.2 % — AB (ref 11.5–14.5)
WBC: 7.4 10*3/uL (ref 3.8–10.6)

## 2014-12-03 MED ORDER — METFORMIN HCL 500 MG PO TABS
1000.0000 mg | ORAL_TABLET | Freq: Every day | ORAL | Status: DC
  Administered 2014-12-04 – 2014-12-05 (×2): 1000 mg via ORAL
  Filled 2014-12-03 (×2): qty 2

## 2014-12-03 MED ORDER — FERROUS SULFATE 325 (65 FE) MG PO TABS: 325.0000 mg | ORAL_TABLET | Freq: Two times a day (BID) | ORAL | Status: DC

## 2014-12-03 MED ORDER — ENOXAPARIN SODIUM 30 MG/0.3ML ~~LOC~~ SOLN: 40.0000 mg | SUBCUTANEOUS | Status: DC

## 2014-12-03 MED ORDER — OXYCODONE HCL 5 MG PO TABS: 5.0000 mg | ORAL_TABLET | ORAL | Status: DC | PRN

## 2014-12-03 MED ORDER — GEMFIBROZIL 600 MG PO TABS
600.0000 mg | ORAL_TABLET | Freq: Every morning | ORAL | Status: DC
  Administered 2014-12-04 – 2014-12-05 (×2): 600 mg via ORAL
  Filled 2014-12-03 (×2): qty 1

## 2014-12-03 NOTE — Outcomes Assessment (Signed)
Pt has met goals for po day knee replacement. Worked well with therapy NV intact, has voided. Pain controlled with oxycodone.

## 2014-12-03 NOTE — Care Management Note (Signed)
Case Management Note  Patient Details  Name: Colin Gutierrez MRN: [id number] Date of Birth: [birth year]  Subjective/Objective:                  Patient resting in bed comfortably waiting to work with PT. Patient plans to return home at discharge and states his son and grandson will provide support in the home along with transportation. He states he has a rolling walker available at home and I have asked that he request that to be brought to hospital for evaluation. He would like to use [facility] at discharge. [phone] for Rx.   Action/Plan: Referral sent to [facility] via fax and message sent to [facility] with Ar[facility] Go of this referral. I attempted to [facility] in [facility]mg  [facility] but no answer.   Expected Discharge Date:                  Expected Discharge Plan:  [place]  In-House Referral:     Discharge planning Services  CM Consult  Post Acute Care Choice:  Home Health Choice offered to:  Patient  DME Arranged:  N/A DME Agency:     HH Arranged:  PT HH Agency:  [place]  Status of Service:  In process, will continue to follow  Medicare Important Message Given:    Date Medicare IM Given:    Medicare IM give by:    Date Additional Medicare IM Given:    Additional Medicare Important Message give by:     If discussed at [place] of Stay Meetings, dates discussed:    Additional Comments:  Marshell Garfinkel, RN 12/03/2014, 10:51 AM

## 2014-12-03 NOTE — Evaluation (Signed)
Physical Therapy Evaluation Patient Details Name: Colin Gutierrez MRN: [id number] DOB: Mar 16, 1946 Today's Date: 12/03/2014   History of Present Illness  Pt is a 69yo white male who reports chronic history of L knee degerative joint disease. Pt elected to undergo TKA on 5/4.   Clinical Impression  Pt presents with impairment of strength, range of motion, and pain, limiting ability to perform ADL, IADL, and mobility at prior level of function, and will benefit from skilled intervention to resolve the above impairments and limitations and restore to PLOF.     Follow Up Recommendations Home health PT;Supervision - Intermittent    Equipment Recommendations       Recommendations for Other Services       Precautions / Restrictions Precautions Precautions: Fall Required Braces or Orthoses:  (Pt able to perform straight leg raise. ) Restrictions Weight Bearing Restrictions: No      Mobility  Bed Mobility Overal bed mobility: Modified Independent                Transfers Overall transfer level: Needs assistance Equipment used: Rolling walker (2 wheeled) Transfers: Sit to/from Stand Sit to Stand: Min guard         General transfer comment: Verbal cues for safe AD use.   Ambulation/Gait Ambulation/Gait assistance: Min guard Ambulation Distance (Feet): 50 Feet Assistive device: Rolling walker (2 wheeled) Gait Pattern/deviations: Antalgic;Decreased weight shift to left     General Gait Details: Pt attempting to WBAT on LLE, given cues for TKE in midstance.  Stairs            Wheelchair Mobility    Modified Rankin (Stroke Patients Only)       Balance Overall balance assessment: Modified Independent                                           Pertinent Vitals/Pain Pain Assessment: 0-10 Pain Score: 3  Pain Descriptors / Indicators: Sore Pain Intervention(s): Monitored during session;RN gave pain meds during session;Ice applied    Home  Living Family/patient expects to be discharged to:: Private residence Living Arrangements: Alone Available Help at Discharge: Family (Grandson) Type of Home: House Home Access: Stairs to enter Entrance Stairs-Rails: None Entrance Stairs-Number of Steps: 1 Home Layout: One level Home Equipment: Environmental consultant - 2 wheels;Toilet riser      Prior Function Level of Independence: Independent               Hand Dominance        Extremity/Trunk Assessment   Upper Extremity Assessment: Overall WFL for tasks assessed           Lower Extremity Assessment: Overall WFL for tasks assessed      Cervical / Trunk Assessment: Normal  Communication   Communication: No difficulties  Cognition Arousal/Alertness: Awake/alert Behavior During Therapy: WFL for tasks assessed/performed Overall Cognitive Status: Within Functional Limits for tasks assessed                      General Comments      Exercises Total Joint Exercises Ankle Circles/Pumps: AROM;Seated;Both;15 reps Quad Sets: AROM;Seated;15 reps;Left Heel Slides: AROM;Seated;15 reps;Left Hip ABduction/ADduction: AROM;Seated;Left;15 reps Goniometric ROM: L Knee: 12-65 degrees      Assessment/Plan    PT Assessment    PT Diagnosis Difficulty walking;Abnormality of gait;Generalized weakness   PT Problem List    PT Treatment Interventions  PT Goals (Current goals can be found in the Care Plan section) Acute Rehab PT Goals Patient Stated Goal: Pt expecting to DC to home with assistance from grandson  PT Goal Formulation: With patient Time For Goal Achievement: 12/17/14 Potential to Achieve Goals: Good    Frequency     Barriers to discharge        Co-evaluation               End of Session Equipment Utilized During Treatment: Gait belt Activity Tolerance: Patient tolerated treatment well;No increased pain Patient left: in chair;with call bell/phone within reach;with chair alarm set Nurse  Communication: Mobility status         Time: 9211-9417 PT Time Calculation (min) (ACUTE ONLY): 39 min   Charges:   PT Evaluation $Initial PT Evaluation Tier I: 1 Procedure PT Treatments $Therapeutic Exercise: 23-37 mins   PT G Codes:        Camille Thau C 01-02-2015, 10:30 AM   Etta Grandchild, PT, DPT, BM 10:31 AM  Jan 02, 2015

## 2014-12-03 NOTE — Progress Notes (Signed)
Clinical Social Worker (CSW) received SNF consult. PT is recommending home health. RN Case Manager aware of above. Please reconsult if future social work needs arise. CSW signing off.   Colin Gutierrez, LCSWA [phone] 

## 2014-12-03 NOTE — Care Management Note (Signed)
Case Management Note  Patient Details  Name: Colin Gutierrez MRN: [id number] Date of Birth: 08-23-1945  I was able to [facility] [phone] of Rx Lovenox 40mg  #14.  Marshell Garfinkel, RN 12/03/2014, 11:32 AM

## 2014-12-03 NOTE — Outcomes Assessment (Signed)
VSS, patient is A+O with no signs of distress. Pain is controlled with current meds. Mild n/ with no emesis. IV fluids infusing with antibiotics.  Foley d/c'd this am.  av1's and teds in place. Polar care in place and hemovac patent.  LLE is dry clean and intact.

## 2014-12-03 NOTE — Progress Notes (Signed)
   Subjective: 1 Day Post-Op Procedure(s) (LRB): TOTAL KNEE ARTHROPLASTY- computer assisted (Left) Patient reports pain as 2 on 0-10 scale.   Patient is well, and has had no acute complaints or problems We will start therapy today.  Plan is to go Home after hospital stay. Patient did report some nausea earlier this morning no longer having any nausea and no vomiting Patient denies any chest pains or shortness of breath. Objective: Vital signs in last 24 hours: Temp:  [97.3 F (36.3 C)-98.3 F (36.8 C)] 98.3 F (36.8 C) (05/05 0534) Pulse Rate:  [38-87] 59 (05/05 0534) Resp:  [10-18] 18 (05/05 0534) BP: (104-141)/(60-81) 112/60 mmHg (05/05 0534) SpO2:  [91 %-100 %] 98 % (05/05 0534) Patient still has original dressing on and subsequently incision cannot be evaluated Heels are non tender and elevated off the bed using rolled towels Intake/Output from previous day: 05/04 0701 - 05/05 0700 In: 4303 [I.V.:3290; Blood:913; IV Piggyback:100] Out: 5597 [Urine:1600; Drains:110; Blood:25] Intake/Output this shift: Total I/O In: -  Out: 910 [Urine:800; Drains:110]   Recent Labs  12/02/14 1330 12/03/14 0423  HGB 12.0* 11.1*    Recent Labs  12/02/14 1330 12/03/14 0423  WBC 8.6 7.4  RBC 3.94* 3.57*  HCT 36.2* 32.9*  PLT 128* 106*    Recent Labs  12/02/14 1330  NA 139  K 4.0  CL 109  CO2 25  BUN 22*  CREATININE 0.96  GLUCOSE 122*  CALCIUM 8.1*   No results for input(s): LABPT, INR in the last 72 hours.  EXAM General - Patient is Alert, Appropriate and Oriented Extremity - Neurologically intact ABD soft Neurovascular intact Sensation intact distally Intact pulses distally Dorsiflexion/Plantar flexion intact Dressing - dressing C/D/I Motor Function - intact, moving foot and toes well on exam. Calves are nontender. no evidence of any  Past Medical History  Diagnosis Date  . Coronary artery disease   . Hypertension   . Heart murmur   . Diabetes mellitus  without complication   . History of hiatal hernia   . Arthritis   . Cancer     leukemia    Assessment/Plan: 1 Day Post-Op Procedure(s) (LRB): TOTAL KNEE ARTHROPLASTY- computer assisted (Left) Active Problems:   Total knee replacement status  Estimated body mass index is 27.98 kg/(m^2) as calculated from the following:   Height as of this encounter: 6\' 1"  (1.854 m).   Weight as of this encounter: 96.163 kg (212 lb). Advance diet Up with therapy D/C IV fluids when tolerating fluids well  Labs: Chemistry profile and hemoglobin tomorrow morning DVT Prophylaxis - Lovenox, Foot Pumps and TED hose Weight-Bearing as tolerated to left leg D/C O2 and Pulse OX and try on Room [employer] [place] 12/03/2014, 6:56 AM

## 2014-12-03 NOTE — Evaluation (Signed)
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 Pt elected to undergo TKA on 5/4.    Clinical Impression   Pt is 69 year old male s/p L TKA who lives at home alone and has a grandson who is going to live with him while he works part time.  He was working part time at [employer] and is currently limited in functional ADLs due to pain, decreased ROM.  He requires minimal assist for sit to stand and transfers during ADLs and for LB dressing without use of AD.  He was independent in ADLs prior to surgery and would benefit from continued OT for education in assistive devices, functional mobility, and education for caregivers.     Follow Up Recommendations  No OT follow up    Equipment Recommendations  Tub/shower seat    Recommendations for Other Services PT consult     Precautions / Restrictions Precautions Precautions: Fall Restrictions Weight Bearing Restrictions: No      Mobility Bed Mobility Overal bed mobility: Modified Independent                Transfers Overall transfer level: Needs assistance Equipment used: Rolling walker (2 wheeled) Transfers: Sit to/from Stand Sit to Stand: Min guard         General transfer comment: Verbal cues for safe AD use.     Balance Overall balance assessment: Modified Independent                                          ADL Overall ADL's : Needs assistance/impaired Eating/Feeding: Independent   Grooming: Wash/dry hands;Wash/dry face;Oral care;Brushing hair;Independent   Upper Body Bathing: Independent   Lower Body Bathing: Independent   Upper Body Dressing : Independent   Lower Body Dressing: Minimal assistance   Toilet Transfer: Minimal assistance Toilet Transfer Details (indicate cue type and  reason):  (using FWW) Toileting- Clothing Manipulation and Hygiene: Minimal assistance       Functional mobility during ADLs: Minimal assistance General ADL Comments:  (Pt requires minimal assistance for pain and decreaed ROM of LE for dressing and fatigue during functional mobility)     Vision     Perception     Praxis      Pertinent Vitals/Pain Pain Assessment: 0-10 Pain Score: 1  Pain Location: L knee Pain Descriptors / Indicators: Aching Pain Intervention(s): Premedicated before session;Ice applied     Hand Dominance     Extremity/Trunk Assessment             Communication     Cognition Arousal/Alertness: Awake/alert Behavior During Therapy: WFL for tasks assessed/performed Overall Cognitive Status: Within Functional Limits for tasks assessed                     General Comments       Exercises       Shoulder Instructions      Home Living                                          Prior Functioning/Environment  OT Diagnosis: Generalized weakness;Acute pain   OT Problem List: Decreased strength;Decreased range of motion   OT Treatment/Interventions: Self-care/ADL training;DME and/or AE instruction    OT Goals(Current goals can be found in the care plan section) Acute Rehab OT Goals Patient Stated Goal: Pt expecting to DC to home with assistance from grandson  OT Goal Formulation: With patient Time For Goal Achievement: 12/17/14 Potential to Achieve Goals: Good  OT Frequency: Min 1X/week   Barriers to D/C:    none noted at this time       Co-evaluation              End of Session Equipment Utilized During Treatment: Gait belt  Activity Tolerance: Patient tolerated treatment well Patient left: in bed;with call bell/phone within reach;with bed alarm set   Time: 1400-1425 OT Time Calculation (min): 25 min Charges:  OT General Charges $OT Visit: 1 Procedure OT Treatments $Self Care/Home  Management : 8-22 mins G-Codes:    Wofford,Susan Dec 11, 2014, 2:40 PM    Chrys Racer, OTR/L

## 2014-12-03 NOTE — Progress Notes (Signed)
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 Pt elected to undergo TKA on 5/4.     PT Comments    Pt  is showing progress as evidenced by increased ambulation distance with less c/o pain. Pt should continue to work toward impairments of ROM and strength in LLE. Pt presenting with impaired strength and ROM, impairing indep in ADL, IADL, and AMB, and will continue to benefit from skilled intervention to restore to PLOF>   Follow Up Recommendations  Home health PT;Supervision - Intermittent     Equipment Recommendations       Recommendations for Other Services       Precautions / Restrictions Precautions Precautions: Fall Required Braces or Orthoses:  (Pt able to perform straight leg raise. ) Restrictions Weight Bearing Restrictions: No    Mobility  Bed Mobility Overal bed mobility: Modified Independent                Transfers Overall transfer level: Needs assistance Equipment used: Rolling walker (2 wheeled) Transfers: Sit to/from Stand Sit to Stand: Min guard         General transfer comment: Verbal cues for safe AD use.   Ambulation/Gait Ambulation/Gait assistance: Min guard Ambulation Distance (Feet): 150 Feet Assistive device: Rolling walker (2 wheeled) Gait Pattern/deviations: Antalgic;Decreased weight shift to left     General Gait Details: Pt attempting to WBAT on LLE, given cues for TKE in midstance.   Stairs            Wheelchair Mobility    Modified Rankin (Stroke Patients Only)       Balance Overall balance assessment: Modified Independent                                  Cognition Arousal/Alertness: Awake/alert Behavior During Therapy: WFL for tasks assessed/performed Overall Cognitive Status: Within Functional Limits for  tasks assessed                      Exercises Total Joint Exercises Ankle Circles/Pumps: AROM;Seated;Both;15 reps Quad Sets: AROM;Seated;15 reps;Left Heel Slides: AROM;Seated;15 reps;Left Hip ABduction/ADduction: AROM;Seated;Left;15 reps Straight Leg Raises: AROM;Seated;Left;15 reps Goniometric ROM: L Knee: 8-80 degrees    General Comments        Pertinent Vitals/Pain Pain Assessment: 0-10 Pain Score: 3  Pain Descriptors / Indicators: Sore Pain Intervention(s): Monitored during session;RN gave pain meds during session;Ice applied    Home Living Family/patient expects to be discharged to:: Private residence Living Arrangements: Alone Available Help at Discharge: Family (Grandson) Type of Home: House Home Access: Stairs to enter Entrance Stairs-Rails: None Home Layout: One level Home Equipment: Environmental consultant - 2 wheels;Toilet riser      Prior Function Level of Independence: Independent          PT Goals (current goals can now be found in the care plan section) Acute Rehab PT Goals Patient Stated Goal: Pt expecting to DC to home with assistance from grandson  PT Goal Formulation: With patient Time For Goal Achievement: 12/17/14 Potential to Achieve Goals: Good Progress towards PT goals: Progressing toward goals    Frequency  BID    PT Plan Current plan remains appropriate    Co-evaluation  End of Session Equipment Utilized During Treatment: Gait belt Activity Tolerance: Patient tolerated treatment well;No increased pain Patient left: with call bell/phone within reach;with chair alarm set;in bed     Time: 9276-3943 PT Time Calculation (min) (ACUTE ONLY): 27 min  Charges:  $Gait Training: 8-22 mins $Therapeutic Exercise: 23-37 mins                    G Codes:      Buccola,Allan C Dec 27, 2014, 1:54 PM Etta Grandchild, PT, DPT, BM

## 2014-12-03 NOTE — Plan of Care (Signed)
Problem: Consults Goal: Diagnosis- Total Joint Replacement Outcome: Progressing Primary Total Knee     

## 2014-12-04 LAB — GLUCOSE, CAPILLARY
GLUCOSE-CAPILLARY: 117 mg/dL — AB (ref 70–99)
GLUCOSE-CAPILLARY: 117 mg/dL — AB (ref 70–99)
Glucose-Capillary: 106 mg/dL — ABNORMAL HIGH (ref 70–99)
Glucose-Capillary: 134 mg/dL — ABNORMAL HIGH (ref 70–99)

## 2014-12-04 LAB — CBC
HEMATOCRIT: 34.7 % — AB (ref 40.0–52.0)
HEMOGLOBIN: 11.6 g/dL — AB (ref 13.0–18.0)
MCH: 30.5 pg (ref 26.0–34.0)
MCHC: 33.3 g/dL (ref 32.0–36.0)
MCV: 91.5 fL (ref 80.0–100.0)
Platelets: 102 10*3/uL — ABNORMAL LOW (ref 150–440)
RBC: 3.79 MIL/uL — ABNORMAL LOW (ref 4.40–5.90)
RDW: 15.2 % — ABNORMAL HIGH (ref 11.5–14.5)
WBC: 7.7 10*3/uL (ref 3.8–10.6)

## 2014-12-04 NOTE — Plan of Care (Signed)
Problem: Consults Goal: Diagnosis- Total Joint Replacement Outcome: Progressing Primary Total Knee     

## 2014-12-04 NOTE — Outcomes Assessment (Signed)
Cont outcome:  Milk of mag administered in am

## 2014-12-04 NOTE — Care Management Note (Signed)
Case Management Note  Patient Details  Name: Colin Gutierrez MRN: [id number] Date of Birth: 09-05-1945  Lovenox $95.16; patient aware and agrees. Marshell Garfinkel, RN 12/04/2014, 2:56 PM

## 2014-12-04 NOTE — OR Nursing (Signed)
Patient is taught how to give himself lovenox injection. Significant orders at the bedside, lovenox patient fact sheet given to patient. Patient verbalized he was comfortable with given himself injections. Call the pharmacy for a lovenox kit and the department was out of stock.

## 2014-12-04 NOTE — Progress Notes (Signed)
   Subjective: 2 Days Post-Op Procedure(s) (LRB): TOTAL KNEE ARTHROPLASTY- computer assisted (Left) Patient reports pain as 2 on 0-10 scale.   Patient is well, and has had no acute complaints or problems We will start therapy today.  Plan is to go Home after hospital stay. no nausea and no vomiting Patient denies any chest pains or shortness of breath. Objective: Vital signs in last 24 hours: Temp:  [97.3 F (36.3 C)-97.9 F (36.6 C)] 97.4 F (36.3 C) (05/06 0741) Pulse Rate:  [55-58] 58 (05/06 0741) Resp:  [17-18] 17 (05/06 0741) BP: (102-132)/(58-72) 110/70 mmHg (05/06 0741) SpO2:  [93 %-97 %] 94 % (05/06 0504) well approximated incision Heels are non tender and elevated off the bed using rolled towels Intake/Output from previous day: 05/05 0701 - 05/06 0700 In: 2115 [P.O.:1140; I.V.:975] Out: 1587 [Urine:1360; Drains:227] Intake/Output this shift:     Recent Labs  12/02/14 1330 12/03/14 0423 12/04/14 0437  HGB 12.0* 11.1* 11.6*    Recent Labs  12/03/14 0423 12/04/14 0437  WBC 7.4 7.7  RBC 3.57* 3.79*  HCT 32.9* 34.7*  PLT 106* 102*    Recent Labs  12/02/14 1330  NA 139  K 4.0  CL 109  CO2 25  BUN 22*  CREATININE 0.96  GLUCOSE 122*  CALCIUM 8.1*   No results for input(s): LABPT, INR in the last 72 hours.  EXAM General - Patient is Alert, Appropriate and Oriented Extremity - Neurologically intact ABD soft Neurovascular intact Sensation intact distally Intact pulses distally Dorsiflexion/Plantar flexion intact Dressing - dressing C/D/I Motor Function - intact, moving foot and toes well on exam.   Past Medical History  Diagnosis Date  . Coronary artery disease   . Hypertension   . Heart murmur   . Diabetes mellitus without complication   . History of hiatal hernia   . Arthritis   . Cancer     leukemia    Assessment/Plan: 2 Days Post-Op Procedure(s) (LRB): TOTAL KNEE ARTHROPLASTY- computer assisted (Left) Active Problems:   Total  knee replacement status  Estimated body mass index is 27.98 kg/(m^2) as calculated from the following:   Height as of this encounter: 6\' 1"  (1.854 m).   Weight as of this encounter: 96.163 kg (212 lb). Up with therapy  Labs: None DVT Prophylaxis - Lovenox, Foot Pumps and TED hose Weight-Bearing as tolerated to left leg D/C O2 and Pulse OX and try on Room [employer] [facility] [place] 12/04/2014, 8:22 AM

## 2014-12-04 NOTE — Discharge Summary (Signed)
Physician Discharge Summary  Patient ID: Colin Gutierrez MRN: [id number] DOB/AGE: [birth year] 71 y.o.  Admit date: 12/02/2014 Discharge date: 12/04/2014  Admission Diagnoses:  OA   Discharge Diagnoses: Patient Active Problem List   Diagnosis Date Noted  . Total knee replacement status 12/01/2014    Past Medical History  Diagnosis Date  . Coronary artery disease   . Hypertension   . Heart murmur   . Diabetes mellitus without complication   . History of hiatal hernia   . Arthritis   . Cancer     leukemia     Transfusion: Patient did receive Autovac transfusion the first 6 hours postoperatively   Consultants (if any):    Discharged Condition: Improved  Hospital Course: Colin Gutierrez is an 69 y.o. male who was admitted 12/02/2014 with a diagnosis of degenerative arthrosis of left knee and went to the operating room on 12/02/2014 and underwent the above named procedures.    Surgeries: Procedure(s): TOTAL KNEE ARTHROPLASTY- computer assisted on 12/02/2014 Patient tolerated the surgery well. Taken to PACU where she was stabilized and then transferred to the orthopedic floor.  Started on Lovenox 30 mg q 12 hrs. Foot pumps applied bilaterally at 80 mmHg. Heels elevated on bed with rolled towels. No evidence of DVT. Negative Homan. Physical therapy started on day #1 for gait training and transfer. OT started day #1 for ADL and assisted devices.  Patient's IV , foley and hemovac was d/c on day #2.  ANESTHESIA: Spinal  ESTIMATED BLOOD LOSS: 25 mL  FLUIDS REPLACED: 2000 mL of crystalloid  TOURNIQUET TIME: 83 minutes  DRAINS: 2 medium drains to a reinfusion system  SOFT TISSUE RELEASES: Anterior cruciate ligament, posterior cruciate ligament, deep and superficial medial collateral ligament, patellofemoral ligament   IMPLANTS UTILIZED: DePuy PFC Sigma size 3 posterior stabilized femoral component (cemented), size 4 MBT tibial component (cemented), 38 mm 3 peg oval dome patella  (cemented), and a 15 mm stabilized rotating platform polyethylene insert.  He was given perioperative antibiotics:  Anti-infectives    Start     Dose/Rate Route Frequency Ordered Stop   12/02/14 1400  ceFAZolin (ANCEF) IVPB 2 g/50 mL premix     2 g 100 mL/hr over 30 Minutes Intravenous Every 6 hours 12/02/14 1302 12/03/14 0905   12/02/14 0715  vancomycin (VANCOCIN) IVPB 1000 mg/200 mL premix     1,000 mg 200 mL/hr over 60 Minutes Intravenous On call to O.R. 12/02/14 3662 12/02/14 0812   12/02/14 0715  ceFAZolin (ANCEF) IVPB 2 g/50 mL premix  Status:  Discontinued     2 g 100 mL/hr over 30 Minutes Intravenous On call to O.R. 12/02/14 0610 12/02/14 1251   12/02/14 0700  ceFAZolin (ANCEF) 2-3 GM-% IVPB SOLR    Comments:  STOLLEY, LORI: cabinet override      12/02/14 0700 12/02/14 0809    .    He was given sequential compression devices, early ambulation, and ankle foot pump for DVT prophylaxis.  He benefited maximally from the hospital stay and there were no complications.    Recent vital signs:  Filed Vitals:   12/04/14 0741  BP: 110/70  Pulse: 58  Temp: 97.4 F (36.3 C)  Resp: 17    Recent laboratory studies:  Lab Results  Component Value Date   HGB 11.6* 12/04/2014   HGB 11.1* 12/03/2014   HGB 12.0* 12/02/2014   Lab Results  Component Value Date   WBC 7.7 12/04/2014   PLT 102* 12/04/2014   Lab  Results  Component Value Date   INR 1.0 11/18/2014   Lab Results  Component Value Date   NA 139 12/02/2014   K 4.0 12/02/2014   CL 109 12/02/2014   CO2 25 12/02/2014   BUN 22* 12/02/2014   CREATININE 0.96 12/02/2014   GLUCOSE 122* 12/02/2014    Discharge Medications:     Medication List    TAKE these medications        allopurinol 100 MG tablet  Commonly known as:  ZYLOPRIM  Take 100 mg by mouth daily.     aspirin EC 81 MG tablet  Take 81 mg by mouth daily.     enoxaparin 30 MG/0.3ML injection  Commonly known as:  LOVENOX  Inject 0.4 mLs (40 mg total)  into the skin daily.     ferrous sulfate 325 (65 FE) MG tablet  Take 1 tablet (325 mg total) by mouth 2 (two) times daily with a meal.     gemfibrozil 600 MG tablet  Commonly known as:  LOPID  Take 600 mg by mouth 2 (two) times daily before a meal.     hydrochlorothiazide 25 MG tablet  Commonly known as:  HYDRODIURIL  Take 25 mg by mouth daily.     metFORMIN 1000 MG tablet  Commonly known as:  GLUCOPHAGE  Take 1,000 mg by mouth 2 (two) times daily with a meal.     metoprolol succinate 50 MG 24 hr tablet  Commonly known as:  TOPROL-XL  Take 50 mg by mouth daily. Take with or immediately following a meal.     OSTEO BI-FLEX TRIPLE STRENGTH PO  Take 1 tablet by mouth 2 (two) times daily as needed.     oxyCODONE 5 MG immediate release tablet  Commonly known as:  Oxy IR/ROXICODONE  Take 1-2 tablets (5-10 mg total) by mouth every 4 (four) hours as needed for breakthrough pain.        Diagnostic Studies: Dg Knee Left Port  12/02/2014   CLINICAL DATA:  Left knee osteoarthritis. Postop from left knee arthroplasty.  EXAM: PORTABLE LEFT KNEE - 1-2 VIEW  COMPARISON:  None.  FINDINGS: Portable AP and lateral views show total knee arthroplasty, with all 3 components in expected position. No evidence of fracture or dislocation. Surgical drain and skin staples are seen in place.  IMPRESSION: Expected postoperative appearance of total knee arthroplasty. No fracture or other complication identified.   Electronically Signed   By: Earle Gell M.D.   On: 12/02/2014 11:56    Disposition:       Discharge Instructions    Diet - low sodium heart healthy    Complete by:  As directed      Diet - low sodium heart healthy    Complete by:  As directed      Diet - low sodium heart healthy    Complete by:  As directed      Diet - low sodium heart healthy    Complete by:  As directed      Increase activity slowly    Complete by:  As directed      Increase activity slowly    Complete by:  As directed       Increase activity slowly    Complete by:  As directed      Increase activity slowly    Complete by:  As directed            Follow-up Information    Call to follow up.  Why:  Patient should already have an appointment scheduled.      Follow up with [facility]   Specialty:  Physician Assistant   Contact information:   [address] [place] [address] [phone]        Signed: Watt Climes 12/04/2014, 8:18 AM

## 2014-12-04 NOTE — Discharge Instructions (Signed)
° °TOTAL KNEE REPLACEMENT POSTOPERATIVE DIRECTIONS ° °Knee Rehabilitation, Guidelines Following Surgery  °Results after knee surgery are often greatly improved when you follow the exercise, range of motion and muscle strengthening exercises prescribed by your doctor. Safety measures are also important to protect the knee from further injury. Any time any of these exercises cause you to have increased pain or swelling in your knee joint, decrease the amount until you are comfortable again and slowly increase them. If you have problems or questions, call your caregiver or physical therapist for advice.  ° °HOME CARE INSTRUCTIONS  °Remove items at home which could result in a fall. This includes throw rugs or furniture in walking pathways.  °· Continue to use polar care unit on the knee for pain and swelling from surgery. You may notice swelling that will progress down to the foot and ankle.  This is normal after surgery.  Elevate the leg when you are not up walking on it.   °· Continue to use the breathing machine which will help keep your temperature down.  It is common for your temperature to cycle up and down following surgery, especially at night when you are not up moving around and exerting yourself.  The breathing machine keeps your lungs expanded and your temperature down. °· Do not place pillow under knee, focus on keeping the knee STRAIGHT while resting ° °DIET °You may resume your previous home diet once your are discharged from the hospital. ° °DRESSING / WOUND CARE / SHOWERING °You may start showering once staples have been removed. Change the surgical dressing as needed. ° °ACTIVITY °Walk with your walker as instructed. °Use walker as long as suggested by your caregivers. °Avoid periods of inactivity such as sitting longer than an hour when not asleep. This helps prevent blood clots.  °You may resume a sexual relationship in one month or when given the OK by your doctor.  °You may return to work once  you are cleared by your doctor.  °Do not drive a car for 6 weeks or until released by you surgeon.  °Do not drive while taking narcotics. ° °WEIGHT BEARING °Weight bearing as tolerated with assist device (walker, cane, etc) as directed, use it as long as suggested by your surgeon or therapist, typically at least 4-6 weeks. ° °POSTOPERATIVE CONSTIPATION PROTOCOL °Constipation - defined medically as fewer than three stools per week and severe constipation as less than one stool per week. ° °One of the most common issues patients have following surgery is constipation.  Even if you have a regular bowel pattern at home, your normal regimen is likely to be disrupted due to multiple reasons following surgery.  Combination of anesthesia, postoperative narcotics, change in appetite and fluid intake all can affect your bowels.  In order to avoid complications following surgery, here are some recommendations in order to help you during your recovery period. ° °Colace (docusate) - Pick up an over-the-counter form of Colace or another stool softener and take twice a day as long as you are requiring postoperative pain medications.  Take with a full glass of water daily.  If you experience loose stools or diarrhea, hold the colace until you stool forms back up.  If your symptoms do not get better within 1 week or if they get worse, check with your doctor. ° °Dulcolax (bisacodyl) - Pick up over-the-counter and take as directed by the product packaging as needed to assist with the movement of your bowels.  Take with a   full glass of water.  Use this product as needed if not relieved by Colace only.   MiraLax (polyethylene glycol) - Pick up over-the-counter to have on hand.  MiraLax is a solution that will increase the amount of water in your bowels to assist with bowel movements.  Take as directed and can mix with a glass of water, juice, soda, coffee, or tea.  Take if you go more than two days without a movement. Do not use  MiraLax more than once per day. Call your doctor if you are still constipated or irregular after using this medication for 7 days in a row.  If you continue to have problems with postoperative constipation, please contact the office for further assistance and recommendations.  If you experience "the worst abdominal pain ever" or develop nausea or vomiting, please contact the office immediatly for further recommendations for treatment.  ITCHING  If you experience itching with your medications, try taking only a single pain pill, or even half a pain pill at a time.  You can also use Benadryl over the counter for itching or also to help with sleep.   TED HOSE STOCKINGS Wear the elastic stockings on both legs for 6 weeks following surgery during the day but you may remove then at night for sleeping.  MEDICATIONS See your medication summary on the After Visit Summary that the nursing staff will review with you prior to discharge.  You may have some home medications which will be placed on hold until you complete the course of blood thinner medication.  It is important for you to complete the blood thinner medication as prescribed by your surgeon.  Continue your approved medications as instructed at time of discharge.  PRECAUTIONS If you experience chest pain or shortness of breath - call 911 immediately for transfer to the hospital emergency department.  If you develop a fever greater that 101 F, purulent drainage from wound, increased redness or drainage from wound, foul odor from the wound/dressing, or calf pain - CONTACT YOUR SURGEON.                                                   FOLLOW-UP APPOINTMENTS Make sure you keep all of your appointments after your operation with your surgeon and caregivers. You should call the office at the above phone number and make an appointment for approximately two weeks after the date of your surgery or on the date instructed by your surgeon outlined in the  "After Visit Summary".   RANGE OF MOTION AND STRENGTHENING EXERCISES  Rehabilitation of the knee is important following a knee injury or an operation. After just a few days of immobilization, the muscles of the thigh which control the knee become weakened and shrink (atrophy). Knee exercises are designed to build up the tone and strength of the thigh muscles and to improve knee motion. Often times heat used for twenty to thirty minutes before working out will loosen up your tissues and help with improving the range of motion but do not use heat for the first two weeks following surgery. These exercises can be done on a training (exercise) mat, on the floor, on a table or on a bed. Use what ever works the best and is most comfortable for you Knee exercises include:  Leg Lifts - While your knee is still  immobilized in a splint or cast, you can do straight leg raises. Lift the leg to 60 degrees, hold for 3 sec, and slowly lower the leg. Repeat 10-20 times 2-3 times daily. Perform this exercise against resistance later as your knee gets better.  Quad and Hamstring Sets - Tighten up the muscle on the front of the thigh (Quad) and hold for 5-10 sec. Repeat this 10-20 times hourly. Hamstring sets are done by pushing the foot backward against an object and holding for 5-10 sec. Repeat as with quad sets.   Leg Slides: Lying on your back, slowly slide your foot toward your buttocks, bending your knee up off the floor (only go as far as is comfortable). Then slowly slide your foot back down until your leg is flat on the floor again.  Angel Wings: Lying on your back spread your legs to the side as far apart as you can without causing discomfort.  A rehabilitation program following serious knee injuries can speed recovery and prevent re-injury in the future due to weakened muscles. Contact your doctor or a physical therapist for more information on knee rehabilitation.   IF YOU ARE TRANSFERRED TO A SKILLED REHAB  FACILITY If the patient is transferred to a skilled rehab facility following release from the hospital, a list of the current medications will be sent to the facility for the patient to continue.  When discharged from the skilled rehab facility, please have the facility set up the patient's [place] prior to being released. Also, the skilled facility will be responsible for providing the patient with their medications at time of release from the facility to include their pain medication, the muscle relaxants, and their blood thinner medication. If the patient is still at the rehab facility at time of the two week follow up appointment, the skilled rehab facility will also need to assist the patient in arranging follow up appointment in our office and any transportation needs.  MAKE SURE YOU:  Understand these instructions.  Get help right away if you are not doing well or get worse.

## 2014-12-04 NOTE — Progress Notes (Signed)
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 Pt elected to undergo TKA on 5/4.     PT Comments    A: Pt is doing very well with PT and is making consistent gains at or above POD2 expectations.  He shows great confidence with bed mobility and transfers.  He has some minimal hesitancy with ambulation, but with cuing and encouragement does well and is able to increase gait distance without issue.  Pt doing very well with pain and shows significant increase with ROM getting within a few degrees of terminal knee extension and >90 degrees of flexion.    Follow Up Recommendations  Home health PT;Supervision - Intermittent     Equipment Recommendations   (pt has walker at home, need to assess if it is appropriate)    Recommendations for Other Services       Precautions / Restrictions Precautions Precautions: Fall Restrictions Weight Bearing Restrictions: No    Mobility  Bed Mobility Overal bed mobility: Modified Independent                Transfers Overall transfer level: Needs assistance Equipment used: Rolling walker (2 wheeled)   Sit to Stand: Min guard         General transfer comment: verbal cues for hand placment, setup and sequencing  Ambulation/Gait Ambulation/Gait assistance: Min guard Ambulation Distance (Feet): 200 Feet Assistive device: Rolling walker (2 wheeled)       General Gait Details: Pt still with some hesitancy on the L with full WBing, but ultimately does well with minimal fatigue, no LOBs and good overall confidence   Stairs            Wheelchair Mobility    Modified Rankin (Stroke Patients Only)       Balance Overall balance assessment: Modified Independent                                  Cognition Arousal/Alertness:  Awake/alert Behavior During Therapy: WFL for tasks assessed/performed Overall Cognitive Status: Within Functional Limits for tasks assessed                      Exercises Total Joint Exercises Quad Sets: 10 reps;Strengthening Short Arc Quad: 15 reps;Strengthening Heel Slides: 10 reps (resisted overpressure) Hip ABduction/ADduction: 10 reps;Strengthening Straight Leg Raises: 10 reps;Strengthening Goniometric ROM: 2-93    General Comments        Pertinent Vitals/Pain Pain Assessment: 0-10 Pain Score: 3  Pain Location: L knee    Home Living                      Prior Function            PT Goals (current goals can now be found in the care plan section) Acute Rehab PT Goals Patient Stated Goal: Go home Progress towards PT goals: Progressing toward goals    Frequency  BID    PT Plan Current plan remains appropriate    Co-evaluation             End of Session Equipment Utilized During Treatment: Gait belt Activity Tolerance: Patient tolerated treatment well Patient left: in chair;with chair alarm set;with call bell/phone within reach  Time: 5537-4827 PT Time Calculation (min) (ACUTE ONLY): 26 min  Charges:  $Gait Training: 8-22 mins $Therapeutic Exercise: 8-22 mins                    G Codes:      Colin Gutierrez, PT, DPT [phone]  Colin Gutierrez 12/04/2014, 10:35 AM

## 2014-12-04 NOTE — Care Management Note (Signed)
Case Management Note  Patient Details  Name: RONAV FURNEY MRN: [id number] Date of Birth: 01-03-1946   Attempted again to reach patients pharmacy [facility] [phone] but no answer to check price/availabilty of Lovenox. I will re-try later.  Marshell Garfinkel, RN 12/04/2014, 9:43 AM

## 2014-12-04 NOTE — Progress Notes (Signed)
OT Cancellation Note   Patient Details Name: Colin Gutierrez MRN: [id number] DOB: [birth year]   Cancelled Treatment:   Attempted to see patient who was in bed sweating and not feeling well and had nausea.  He has not had a BM and was receiving a laxative to help him have BM which could help resolve the issue.  Pt decline OT at this time and will attempt to see him again tomorrow.    Hibba Schram    Chrys Racer, OTR/L  12/04/2014, 4:10 PM

## 2014-12-04 NOTE — Progress Notes (Signed)
Physical Therapy Treatment Patient Details Name: ROMULUS HANRAHAN MRN: [id number] DOB: 02-26-1946 Today's Date: 12/04/2014    History of Present Illness TKA 12/02/14    PT Comments    A: Pt reports that he let his pain get ahead of him but he has gotten meds and is now controlled with 3-4/10 pain.  He does well with mobility and ambulation and shows no issues with stairs.  He has very little fatigue with >200 ft of ambulation and ultimately is above POD2 expectations.     Follow Up Recommendations  Home health PT;Supervision - Intermittent     Equipment Recommendations   (pt has walker at home, need to assess if it is appropriate)    Recommendations for Other Services       Precautions / Restrictions Precautions Precautions: Fall Restrictions Weight Bearing Restrictions: No    Mobility  Bed Mobility Overal bed mobility: Independent                Transfers Overall transfer level: Modified independent Equipment used: Rolling walker (2 wheeled)   Sit to Stand: Supervision         General transfer comment: verbal cues for hand placment, setup and sequencing  Ambulation/Gait Ambulation/Gait assistance: Supervision Ambulation Distance (Feet): 225 Feet Assistive device: Rolling walker (2 wheeled)       General Gait Details: Pt still with some hesitancy on the L with full WBing, but ultimately does well with minimal fatigue, no LOBs and good overall confidence   Stairs Stairs: Yes Stairs assistance: Min guard Stair Management: No rails;Backwards;With walker (minimal cuing for sequencing and safety, pt does well)      Wheelchair Mobility    Modified Rankin (Stroke Patients Only)       Balance                                    Cognition Arousal/Alertness: Awake/alert Behavior During Therapy: WFL for tasks assessed/performed Overall Cognitive Status: Within Functional Limits for tasks assessed                      Exercises  Total Joint Exercises Quad Sets: 10 reps;Strengthening Short Arc Quad: 15 reps;Strengthening Heel Slides: 10 reps (resisted overpressure) Hip ABduction/ADduction: Strengthening;15 reps Straight Leg Raises: 10 reps;Strengthening Goniometric ROM: pt able to achieve 90 degrees flexion with AROM    General Comments        Pertinent Vitals/Pain Pain Score: 4  Pain Location: L knee    Home Living                      Prior Function            PT Goals (current goals can now be found in the care plan section) Acute Rehab PT Goals Patient Stated Goal: Go home Progress towards PT goals: Progressing toward goals    Frequency  BID    PT Plan Current plan remains appropriate    Co-evaluation             End of Session Equipment Utilized During Treatment: Gait belt Activity Tolerance: Patient tolerated treatment well Patient left: in bed;with bed alarm set     Time: 1425-1451 PT Time Calculation (min) (ACUTE ONLY): 26 min  Charges:  $Gait Training: 8-22 mins $Therapeutic Exercise: 8-22 mins  G Codes:     Wayne Both, PT, DPT [phone]   Kreg Shropshire 12/04/2014, 3:11 PM

## 2014-12-04 NOTE — Outcomes Assessment (Signed)
VSS, patient is A+O with no signs of distress.  Pain is controlled with current medications.  No complaints of n/v.  teds and av1's in place. Polar care in place.  Appears to have slept well.

## 2014-12-05 LAB — CBC
HEMATOCRIT: 33.2 % — AB (ref 40.0–52.0)
Hemoglobin: 10.9 g/dL — ABNORMAL LOW (ref 13.0–18.0)
MCH: 30 pg (ref 26.0–34.0)
MCHC: 32.7 g/dL (ref 32.0–36.0)
MCV: 91.8 fL (ref 80.0–100.0)
Platelets: 96 10*3/uL — ABNORMAL LOW (ref 150–440)
RBC: 3.62 MIL/uL — ABNORMAL LOW (ref 4.40–5.90)
RDW: 15 % — ABNORMAL HIGH (ref 11.5–14.5)
WBC: 6.7 10*3/uL (ref 3.8–10.6)

## 2014-12-05 LAB — GLUCOSE, CAPILLARY
GLUCOSE-CAPILLARY: 108 mg/dL — AB (ref 70–99)
Glucose-Capillary: 113 mg/dL — ABNORMAL HIGH (ref 70–99)

## 2014-12-05 MED ORDER — LACTULOSE 10 GM/15ML PO SOLN
20.0000 g | Freq: Once | ORAL | Status: AC
  Administered 2014-12-05: 20 g via ORAL
  Filled 2014-12-05: qty 30

## 2014-12-05 NOTE — Outcomes Assessment (Signed)
VSS, patient is A+O with no signs of distress. Appears to have slept well. Pain is controlled with current medications.  Voids without difficulty.  Milk of mag administered this am still awaiting for BM.

## 2014-12-05 NOTE — Progress Notes (Signed)
Physical Therapy Treatment Patient Details Name: Colin Gutierrez MRN: [id number] DOB: 1946-06-15 Today's Date: 12/05/2014    History of Present Illness TKA 12/02/14    PT Comments    A: Pt continues to progress very well and shows good confidence with ambulation.  He continues to have minimal pain and is not limited secondary to this. He has PROM full extension, but still lacks ~2 degrees with AROM, pt should be able to return home today with HHPT.     Follow Up Recommendations  Home health PT;Supervision - Intermittent     Equipment Recommendations   (pt has walker at home, instructed on how to set it to the ri)    Recommendations for Other Services       Precautions / Restrictions Precautions Precautions:  (moderate fall risk) Restrictions Weight Bearing Restrictions: No    Mobility  Bed Mobility Overal bed mobility: Independent                Transfers Overall transfer level: Modified independent Equipment used: Rolling walker (2 wheeled)   Sit to Stand: Modified independent (Device/Increase time)         General transfer comment: verbal cues for hand placment, setup and sequencing  Ambulation/Gait Ambulation/Gait assistance: Supervision Ambulation Distance (Feet): 200 Feet Assistive device: Rolling walker (2 wheeled)       General Gait Details: Pt still with less hesitancy with full WBing and showing improved posture and tolerance for TKE on L, ultimately does well with minimal fatigue, no LOBs and good overall confidence   Stairs            Wheelchair Mobility    Modified Rankin (Stroke Patients Only)       Balance                                    Cognition Arousal/Alertness: Awake/alert Behavior During Therapy: WFL for tasks assessed/performed Overall Cognitive Status: Within Functional Limits for tasks assessed                      Exercises Total Joint Exercises Quad Sets: 10 reps;Strengthening Short  Arc Quad: 15 reps;Strengthening Heel Slides: 10 reps (resisted flexion overpressure) Hip ABduction/ADduction: Strengthening;15 reps Straight Leg Raises: 10 reps;Strengthening Goniometric ROM: pt again able to achieve >90 with AROM after warm up reps    General Comments        Pertinent Vitals/Pain Pain Score: 2     Home Living                      Prior Function            PT Goals (current goals can now be found in the care plan section) Acute Rehab PT Goals Patient Stated Goal: Go home today Progress towards PT goals: Progressing toward goals    Frequency  BID    PT Plan Current plan remains appropriate    Co-evaluation             End of Session Equipment Utilized During Treatment: Gait belt Activity Tolerance: Patient tolerated treatment well Patient left: in bed;with bed alarm set     Time:  -     Charges:  $Gait Training: 8-22 mins $Therapeutic Exercise: 8-22 mins                    G Codes:  Colin Gutierrez, PT, DPT [phone]  Kreg Shropshire 12/05/2014, 10:20 AM

## 2014-12-05 NOTE — Progress Notes (Addendum)
Pt discharged home via  Wheelchair.. D/c home on new iron,new lovenox and new oxycodone. Discharge teaching complete. I/s on polar care,use and care, ted hose use and care . Understanding voiced .

## 2014-12-05 NOTE — Progress Notes (Signed)
   Subjective: 3 Days Post-Op Procedure(s) (LRB): TOTAL KNEE ARTHROPLASTY- computer assisted (Left) Patient reports pain as mild.   Patient is well, and has had no acute complaints or problems. Pt has yet to have BM. No abdominal pain. We will continue with therapy today.  Plan is to go Home after hospital stay.  Objective: Vital signs in last 24 hours: Temp:  [97.6 F (36.4 C)-98.7 F (37.1 C)] 98.1 F (36.7 C) (05/07 0725) Pulse Rate:  [51-62] 58 (05/07 0725) Resp:  [16-20] 17 (05/07 0725) BP: (104-127)/(64-72) 104/70 mmHg (05/07 0725) SpO2:  [96 %-97 %] 96 % (05/07 0456)  Intake/Output from previous day: 05/06 0701 - 05/07 0700 In: 480 [P.O.:480] Out: 650 [Urine:650] Intake/Output this shift: Total I/O In: -  Out: 275 [Urine:275]   Recent Labs  12/02/14 1330 12/03/14 0423 12/04/14 0437 12/05/14 0353  HGB 12.0* 11.1* 11.6* 10.9*    Recent Labs  12/04/14 0437 12/05/14 0353  WBC 7.7 6.7  RBC 3.79* 3.62*  HCT 34.7* 33.2*  PLT 102* 96*    Recent Labs  12/02/14 1330  NA 139  K 4.0  CL 109  CO2 25  BUN 22*  CREATININE 0.96  GLUCOSE 122*  CALCIUM 8.1*   No results for input(s): LABPT, INR in the last 72 hours.  EXAM General - Patient is Alert, Appropriate and Oriented Extremity - Neurologically intact Neurovascular intact Sensation intact distally Intact pulses distally Dorsiflexion/Plantar flexion intact able to straight leg raise Dressing - dressing C/D/I and no drainage Motor Function - intact, moving foot and toes well on exam. Able to SLR  Past Medical History  Diagnosis Date  . Coronary artery disease   . Hypertension   . Heart murmur   . Diabetes mellitus without complication   . History of hiatal hernia   . Arthritis   . Cancer     leukemia    Assessment/Plan:   3 Days Post-Op Procedure(s) (LRB): TOTAL KNEE ARTHROPLASTY- computer assisted (Left) Active Problems:   Total knee replacement status  Estimated body mass index is  27.98 kg/(m^2) as calculated from the following:   Height as of this encounter: 6\' 1"  (1.854 m).   Weight as of this encounter: 212 lb (96.163 kg). Discharge home with home health  DVT Prophylaxis - Lovenox Weight-Bearing as tolerated to Left leg D/C O2 and Pulse OX and try on Room Air  T. Rachelle Hora, PA-C [place] 12/05/2014, 8:03 AM

## 2014-12-07 ENCOUNTER — Encounter: Payer: Self-pay | Admitting: Orthopedic Surgery

## 2015-01-07 ENCOUNTER — Other Ambulatory Visit: Payer: Self-pay

## 2015-01-07 DIAGNOSIS — C9110 Chronic lymphocytic leukemia of B-cell type not having achieved remission: Secondary | ICD-10-CM

## 2015-01-08 ENCOUNTER — Ambulatory Visit: Payer: Self-pay | Admitting: Internal Medicine

## 2015-01-08 ENCOUNTER — Other Ambulatory Visit: Payer: Self-pay

## 2015-02-08 ENCOUNTER — Inpatient Hospital Stay: Payer: Medicare Other | Attending: Internal Medicine | Admitting: Internal Medicine

## 2015-02-08 ENCOUNTER — Inpatient Hospital Stay: Payer: Medicare Other

## 2015-02-08 VITALS — BP 137/82 | HR 74 | Temp 98.4°F | Resp 18 | Ht 73.0 in | Wt 195.5 lb

## 2015-02-08 DIAGNOSIS — M129 Arthropathy, unspecified: Secondary | ICD-10-CM | POA: Diagnosis not present

## 2015-02-08 DIAGNOSIS — R630 Anorexia: Secondary | ICD-10-CM | POA: Diagnosis not present

## 2015-02-08 DIAGNOSIS — K449 Diaphragmatic hernia without obstruction or gangrene: Secondary | ICD-10-CM | POA: Insufficient documentation

## 2015-02-08 DIAGNOSIS — E119 Type 2 diabetes mellitus without complications: Secondary | ICD-10-CM | POA: Diagnosis not present

## 2015-02-08 DIAGNOSIS — C9110 Chronic lymphocytic leukemia of B-cell type not having achieved remission: Secondary | ICD-10-CM | POA: Diagnosis present

## 2015-02-08 DIAGNOSIS — R634 Abnormal weight loss: Secondary | ICD-10-CM | POA: Insufficient documentation

## 2015-02-08 DIAGNOSIS — I10 Essential (primary) hypertension: Secondary | ICD-10-CM

## 2015-02-08 DIAGNOSIS — Z7982 Long term (current) use of aspirin: Secondary | ICD-10-CM | POA: Diagnosis not present

## 2015-02-08 DIAGNOSIS — R5383 Other fatigue: Secondary | ICD-10-CM | POA: Diagnosis not present

## 2015-02-08 DIAGNOSIS — I2510 Atherosclerotic heart disease of native coronary artery without angina pectoris: Secondary | ICD-10-CM | POA: Diagnosis not present

## 2015-02-08 DIAGNOSIS — Z87891 Personal history of nicotine dependence: Secondary | ICD-10-CM | POA: Diagnosis not present

## 2015-02-08 DIAGNOSIS — Z79899 Other long term (current) drug therapy: Secondary | ICD-10-CM | POA: Diagnosis not present

## 2015-02-08 LAB — CBC WITH DIFFERENTIAL/PLATELET
Basophils Absolute: 0 10*3/uL (ref 0–0.1)
Basophils Relative: 1 %
Eosinophils Absolute: 0.3 10*3/uL (ref 0–0.7)
Eosinophils Relative: 4 %
HEMATOCRIT: 40.7 % (ref 40.0–52.0)
Hemoglobin: 13.4 g/dL (ref 13.0–18.0)
Lymphocytes Relative: 41 %
Lymphs Abs: 2.8 10*3/uL (ref 1.0–3.6)
MCH: 29.8 pg (ref 26.0–34.0)
MCHC: 33 g/dL (ref 32.0–36.0)
MCV: 90.4 fL (ref 80.0–100.0)
MONOS PCT: 9 %
Monocytes Absolute: 0.6 10*3/uL (ref 0.2–1.0)
Neutro Abs: 3 10*3/uL (ref 1.4–6.5)
Neutrophils Relative %: 45 %
Platelets: 206 10*3/uL (ref 150–440)
RBC: 4.5 MIL/uL (ref 4.40–5.90)
RDW: 16.1 % — AB (ref 11.5–14.5)
WBC: 6.8 10*3/uL (ref 3.8–10.6)

## 2015-02-08 LAB — CREATININE, SERUM
Creatinine, Ser: 1.11 mg/dL (ref 0.61–1.24)
GFR calc Af Amer: >60 mL/min (ref >60)

## 2015-02-08 LAB — LACTATE DEHYDROGENASE: LDH: 140 U/L (ref 98–192)

## 2015-02-08 NOTE — Progress Notes (Signed)
Pt doing fine, no infections, fevers or enlarged lymph nodes. Pt has wt loss after TKR and went to see md about it and sometimes people who get general anesthesia it affects that way and it take 4-5 weeks and pt states he is now eating.

## 2015-02-16 NOTE — Progress Notes (Signed)
[place]  Telephone:([phone] Fax:(336) [phone]     ID: Colin Gutierrez OB: 06/11/1946  MR#: [id number]  ZWC#:[id number]  Patient Care Team: Madelyn Brunner, MD as PCP - General (Internal Medicine)  CHIEF COMPLAINT/DIAGNOSIS:  1. Colin Gutierrez a. 11/10/10: Flow cytometry, peripheral blood: CD5+, CD 23+, monoclonal B cell population is detected with lambda light chain restriction representing 52% of leukocytes. CD8 is dimly expressed on the clonal B cells. Phemotype is typical oc Gutierrez/sll. CD38 is not expressed on the clonal B cells. No citculating blasts detected. CD4: CD8 = 5.2. b. 11/21/10: CT chest/abdomen/pelvis:  No evidence of abnormal lymphadenopathy or splenomegaly. 5 mm stone in distal ureter. Mild distension of GB.  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 Overall states that he is doing about the same, he has chronic fatigue on exertion which is unchanged. Weight is down to 195 pounds today, he was 113 pounds June 2015. States that he lost appetite almost completely after knee surgery and was told that it happens to some patients from anesthesia effect, and that he has now started eating really well and feels that he has gained a few pounds back. States that he would monitor this and let us know if he starts having any unintentional weight loss. Denies any fever, chills, or night sweats. CBC today shows WBC remains in the normal range. Denies any recurrent infections.  Denies feeling any lymph node masses on self-exam.  REVIEW OF SYSTEMS:   ROS As in HPI above. In addition, no fevers or sweats. No new headaches or focal weakness.  No new mood disturbances. No  sore throat, cough, shortness of breath, sputum, hemoptysis or chest pain. No dizziness or palpitation. No abdominal pain, constipation, diarrhea, dysuria or hematuria. No new skin rash  or bleeding symptoms. No new paresthesias in extremities.   PAST MEDICAL HISTORY: Reviewed. Past Medical History  Diagnosis Date  . Coronary artery disease   . Hypertension   . Heart murmur   . Diabetes mellitus without complication   . History of hiatal hernia   . Arthritis   . Cancer     leukemia    PAST SURGICAL HISTORY: Reviewed. Past Surgical History  Procedure Laterality Date  . Cardiac electrophysiology study and ablation      1995  . Asd repair      age 20  . Hernia repair    . Joint replacement    . Total knee arthroplasty Left 12/02/2014    Procedure: TOTAL KNEE ARTHROPLASTY- computer assisted;  Surgeon: Dereck Leep, MD;  Location: [facility] ORS;  Service: Orthopedics;  Laterality: Left;    FAMILY HISTORY: Reviewed. Patient sister had kidney cancer in her 76's.  SOCIAL HISTORY: Reviewed. History  Substance Use Topics  . Smoking status: Former Research scientist (life sciences)  . Smokeless tobacco: Never Used  . Alcohol Use: 1.2 oz/week    2 Glasses of wine per week    No Known Allergies  Current Outpatient Prescriptions  Medication Sig Dispense Refill  . allopurinol (ZYLOPRIM) 100 MG tablet Take 100 mg by mouth daily.    Marland Kitchen aspirin EC 81 MG tablet Take 81 mg by mouth daily.    . ferrous sulfate 325 (65 FE) MG tablet Take 1 tablet (325 mg total) by mouth 2 (two) times daily with a meal. (Patient taking differently: Take 325 mg by mouth daily. )  30 tablet 0  . gemfibrozil (LOPID) 600 MG tablet Take 600 mg by mouth 2 (two) times daily before a meal.    . hydrochlorothiazide (HYDRODIURIL) 25 MG tablet Take 25 mg by mouth daily.    . metFORMIN (GLUCOPHAGE) 1000 MG tablet Take 1,000 mg by mouth 2 (two) times daily with a meal.    . metoprolol succinate (TOPROL-XL) 50 MG 24 hr tablet Take 50 mg by mouth daily. Take with or immediately following a meal.    . oxyCODONE (OXY IR/ROXICODONE) 5 MG immediate release tablet Take 1-2 tablets (5-10 mg total) by mouth every 4 (four) hours as needed for  breakthrough pain. 80 tablet 0   No current facility-administered medications for this visit.    PHYSICAL EXAM: Filed Vitals:   02/08/15 1555  BP: 137/82  Pulse: 74  Temp: 98.4 F (36.9 C)  Resp: 18     Body mass index is 25.81 kg/(m^2).      GENERAL: Patient is alert and oriented and in no acute distress. There is no icterus or pallor. HEENT: EOMs intact. No cervical lymphadenopathy. CVS: S1S2, regular LUNGS: Bilaterally clear to auscultation, no rhonchi. ABDOMEN: Soft, nontender. No hepatosplenomegaly clinically.  EXTREMITIES: No pedal edema. LYMPHATICS: No palpable adenopathy in axillary or inguinal areas.   LAB RESULTS:    Component Value Date/Time   NA 139 12/02/2014 1330   NA 140 11/18/2014 0917   K 4.0 12/02/2014 1330   K 4.0 11/18/2014 0917   CL 109 12/02/2014 1330   CL 103 11/18/2014 0917   CO2 25 12/02/2014 1330   CO2 28 11/18/2014 0917   GLUCOSE 122* 12/02/2014 1330   GLUCOSE 120* 11/18/2014 0917   BUN 22* 12/02/2014 1330   BUN 29* 11/18/2014 0917   CREATININE 1.11 02/08/2015 1525   CREATININE 1.13 11/18/2014 0917   CALCIUM 8.1* 12/02/2014 1330   CALCIUM 9.4 11/18/2014 0917   PROT 8.5* 12/22/2011 1109   ALBUMIN 4.4 12/22/2011 1109   AST 21 12/22/2011 1109   ALT 19 12/22/2011 1109   ALKPHOS 70 12/22/2011 1109   BILITOT 0.4 12/22/2011 1109   GFRNONAA >60 02/08/2015 1525   GFRNONAA >60 11/18/2014 0917   GFRAA >60 02/08/2015 1525   GFRAA >60 11/18/2014 0917   Lab Results  Component Value Date   WBC 6.8 02/08/2015   NEUTROABS 3.0 02/08/2015   HGB 13.4 02/08/2015   HCT 40.7 02/08/2015   MCV 90.4 02/08/2015   PLT 206 02/08/2015     ASSESSMENT / PLAN:   Chronic Lymphocytic Leukemia (Early Stage, 0)  -  reviewed labs from today are discussed with patient in detail. I have explained that WBC count continues to remain in the normal range at 6800, absolute lymphocyte count also is normal at 3.0. He has had issues with weight loss but attributes this to  recent anesthesia effect and is eating better, have advised him to monitor weight record and let us know if he develops any significant unintentional weight loss. Otherwise doing well without any fevers or recurrent infections.  No lymphadenopathy or hepatosplenomegaly on exam today. No anemia or thrombocytopenia either.  Patient explained that he remains in early stage Gutierrez, no treatment needed at this time.  States that he visits with primary physician at least 2-3 times a year, have advised him to get CBC faxed when he goes there for lab visits. Will see him back in one year with repeat CBC, creatinine, LDH.  In between visits was advised to call in case  of any fevers, night sweats, unintentional weight loss, lymph node masses felt on self-exam or other new symptoms.  He is agreeable to this plan.    Leia Alf, MD   02/16/2015 11:42 AM

## 2016-02-08 ENCOUNTER — Inpatient Hospital Stay: Payer: Medicare Other

## 2016-02-08 ENCOUNTER — Ambulatory Visit: Payer: Medicare Other | Admitting: Internal Medicine

## 2016-02-08 ENCOUNTER — Other Ambulatory Visit: Payer: Medicare Other

## 2016-02-08 ENCOUNTER — Encounter: Payer: Self-pay | Admitting: Family Medicine

## 2016-02-08 ENCOUNTER — Inpatient Hospital Stay: Payer: Medicare Other | Attending: Family Medicine | Admitting: Family Medicine

## 2016-02-08 ENCOUNTER — Ambulatory Visit: Payer: Medicare Other

## 2016-02-08 VITALS — BP 155/91 | HR 69 | Temp 98.0°F | Wt 220.2 lb

## 2016-02-08 DIAGNOSIS — C9111 Chronic lymphocytic leukemia of B-cell type in remission: Secondary | ICD-10-CM

## 2016-02-08 DIAGNOSIS — M129 Arthropathy, unspecified: Secondary | ICD-10-CM | POA: Insufficient documentation

## 2016-02-08 DIAGNOSIS — E119 Type 2 diabetes mellitus without complications: Secondary | ICD-10-CM | POA: Diagnosis not present

## 2016-02-08 DIAGNOSIS — K449 Diaphragmatic hernia without obstruction or gangrene: Secondary | ICD-10-CM | POA: Diagnosis not present

## 2016-02-08 DIAGNOSIS — Z7984 Long term (current) use of oral hypoglycemic drugs: Secondary | ICD-10-CM | POA: Diagnosis not present

## 2016-02-08 DIAGNOSIS — C9110 Chronic lymphocytic leukemia of B-cell type not having achieved remission: Secondary | ICD-10-CM

## 2016-02-08 DIAGNOSIS — I10 Essential (primary) hypertension: Secondary | ICD-10-CM | POA: Insufficient documentation

## 2016-02-08 DIAGNOSIS — Z79899 Other long term (current) drug therapy: Secondary | ICD-10-CM | POA: Insufficient documentation

## 2016-02-08 DIAGNOSIS — Z7982 Long term (current) use of aspirin: Secondary | ICD-10-CM | POA: Insufficient documentation

## 2016-02-08 DIAGNOSIS — R011 Cardiac murmur, unspecified: Secondary | ICD-10-CM

## 2016-02-08 DIAGNOSIS — I2510 Atherosclerotic heart disease of native coronary artery without angina pectoris: Secondary | ICD-10-CM | POA: Diagnosis not present

## 2016-02-08 HISTORY — DX: Chronic lymphocytic leukemia of B-cell type not having achieved remission: C91.10

## 2016-02-08 LAB — CBC WITH DIFFERENTIAL/PLATELET
Basophils Absolute: 0.1 10*3/uL (ref 0–0.1)
Basophils Relative: 1 %
EOS ABS: 0.2 10*3/uL (ref 0–0.7)
Eosinophils Relative: 3 %
HCT: 41.6 % (ref 40.0–52.0)
HEMOGLOBIN: 14.3 g/dL (ref 13.0–18.0)
LYMPHS ABS: 3.1 10*3/uL (ref 1.0–3.6)
LYMPHS PCT: 43 %
MCH: 30.7 pg (ref 26.0–34.0)
MCHC: 34.4 g/dL (ref 32.0–36.0)
MCV: 89.2 fL (ref 80.0–100.0)
Monocytes Absolute: 0.7 10*3/uL (ref 0.2–1.0)
Monocytes Relative: 9 %
NEUTROS ABS: 3.3 10*3/uL (ref 1.4–6.5)
NEUTROS PCT: 44 %
Platelets: 159 10*3/uL (ref 150–440)
RBC: 4.66 MIL/uL (ref 4.40–5.90)
RDW: 14.6 % — ABNORMAL HIGH (ref 11.5–14.5)
WBC: 7.3 10*3/uL (ref 3.8–10.6)

## 2016-02-08 LAB — LACTATE DEHYDROGENASE: LDH: 143 U/L (ref 98–192)

## 2016-02-08 LAB — CREATININE, SERUM
CREATININE: 1.15 mg/dL (ref 0.61–1.24)
GFR calc Af Amer: >60 mL/min (ref >60)
GFR calc non Af Amer: >60 mL/min (ref >60)

## 2016-02-08 NOTE — Progress Notes (Signed)
Colin Gutierrez  Telephone:(336) [phone]  Fax:(336) [phone]     Colin Gutierrez Colin Gutierrez DOB: [birth year]  MR#: [id number]  FYT#:[id number]  Patient Care Team: Madelyn Brunner, MD as PCP - General (Internal Medicine)  CHIEF COMPLAINT:  Chief Complaint  Patient presents with  . Cancer    follow up    INTERVAL HISTORY:  Patient returns for continued follow-up regarding chronic lymphocytic leukemia. His last seen in our clinic by Dr. Ma Hillock in July 2016. Patient overall reports feeling very well. He denies any acute hospitalizations or illnesses since his last visit. He continues to follow very closely with his primary care provider, Dr. Lisette Grinder. Patient denies any unusual or nodules, lumps, bumps, fever, chills, or night sweats.  REVIEW OF SYSTEMS:   Review of Systems  Constitutional: Negative for fever, chills, weight loss, malaise/fatigue and diaphoresis.  HENT: Negative.   Eyes: Negative.   Respiratory: Negative for cough, hemoptysis, sputum production, shortness of breath and wheezing.   Cardiovascular: Negative for chest pain, palpitations, orthopnea, claudication, leg swelling and PND.  Gastrointestinal: Negative for heartburn, nausea, vomiting, abdominal pain, diarrhea, constipation, blood in stool and melena.  Genitourinary: Negative.   Musculoskeletal: Negative.   Skin: Negative.   Neurological: Negative for dizziness, tingling, focal weakness, seizures and weakness.  Endo/Heme/Allergies: Does not bruise/bleed easily.  Psychiatric/Behavioral: Negative for depression. The patient is not nervous/anxious and does not have insomnia.     As per HPI. Otherwise, a complete review of systems is negatve.  ONCOLOGY HISTORY: Oncology History   1. Rai stage 0 CLL a. 11/10/10: Flow cytometry, peripheral blood: CD5+, CD 23+, monoclonal B cell population is detected with lambda light chain restriction representing 52% of leukocytes. CD8 is dimly expressed on the clonal B cells.  Phemotype is typical oc cll/sll. CD38 is not expressed on the clonal B cells. No citculating blasts detected. CD4: CD8 = 5.2. b. 11/21/10: CT chest/abdomen/pelvis: No evidence of abnormal lymphadenopathy or splenomegaly. 5 mm stone in distal ureter. Mild distension of GB.     CLL (chronic lymphocytic leukemia) ([place])   02/08/2016 Initial Diagnosis CLL (chronic lymphocytic leukemia) ([facility])    PAST MEDICAL HISTORY: Past Medical History  Diagnosis Date  . Coronary artery disease   . Hypertension   . Heart murmur   . Diabetes mellitus without complication ([place])   . History of hiatal hernia   . Arthritis   . Cancer ([place])     leukemia  . CLL (chronic lymphocytic leukemia) ([place]) 02/08/2016    PAST SURGICAL HISTORY: Past Surgical History  Procedure Laterality Date  . Cardiac electrophysiology study and ablation      1995  . Asd repair      age 60  . Hernia repair    . Joint replacement    . Total knee arthroplasty Left 12/02/2014    Procedure: TOTAL KNEE ARTHROPLASTY- computer assisted;  Surgeon: Dereck Leep, MD;  Location: [facility] ORS;  Service: Orthopedics;  Laterality: Left;    FAMILY HISTORY No family history on file.  GYNECOLOGIC HISTORY:  No LMP for male patient.     ADVANCED DIRECTIVES:    HEALTH MAINTENANCE: Social History  Substance Use Topics  . Smoking status: Former Research scientist (life sciences)  . Smokeless tobacco: Never Used  . Alcohol Use: 1.2 oz/week    2 Glasses of wine per week     No Known Allergies  Current Outpatient Prescriptions  Medication Sig Dispense Refill  . allopurinol (ZYLOPRIM) 100 MG tablet Take 100 mg by  mouth daily.    Marland Kitchen aspirin EC 81 MG tablet Take 81 mg by mouth daily.    Marland Kitchen gemfibrozil (LOPID) 600 MG tablet Take 600 mg by mouth 2 (two) times daily before a meal.    . Glucosamine-Chondroitin 250-200 MG TABS Take by mouth.    . metFORMIN (GLUCOPHAGE) 1000 MG tablet Take 1,000 mg by mouth 2 (two) times daily with a meal. Reported on 02/08/2016    .  metoprolol succinate (TOPROL-XL) 50 MG 24 hr tablet Take 50 mg by mouth daily. Take with or immediately following a meal.    . cephALEXin (KEFLEX) 500 MG capsule Reported on 02/08/2016  0  . ferrous sulfate 325 (65 FE) MG tablet Take 1 tablet (325 mg total) by mouth 2 (two) times daily with a meal. (Patient not taking: Reported on 02/08/2016) 30 tablet 0  . hydrochlorothiazide (HYDRODIURIL) 25 MG tablet Take 25 mg by mouth daily. Reported on 02/08/2016    . HYDROcodone-acetaminophen (NORCO/VICODIN) 5-325 MG tablet Take by mouth. Reported on 02/08/2016    . oxyCODONE (OXY IR/ROXICODONE) 5 MG immediate release tablet Take 1-2 tablets (5-10 mg total) by mouth every 4 (four) hours as needed for breakthrough pain. (Patient not taking: Reported on 02/08/2016) 80 tablet 0  . sildenafil (REVATIO) 20 MG tablet Reported on 02/08/2016    . silver sulfADIAZINE (SILVADENE) 1 % cream Reported on 02/08/2016  0   No current facility-administered medications for this visit.    OBJECTIVE: BP 155/91 mmHg  Pulse 69  Temp(Src) 98 F (36.7 C) (Tympanic)  Wt 220 lb 3.2 oz (99.882 kg)   Body mass index is 29.06 kg/(m^2).    ECOG FS:0 - Asymptomatic  General: Well-developed, well-nourished, no acute distress. Eyes: Pink conjunctiva, anicteric sclera. HEENT: Normocephalic, moist mucous membranes, clear oropharnyx. Lungs: Clear to auscultation bilaterally. Heart: Regular rate and rhythm. No rubs, murmurs, or gallops. Abdomen: Soft, nontender, nondistended. No organomegaly noted, normoactive bowel sounds. Musculoskeletal: No edema, cyanosis, or clubbing. Neuro: Alert, answering all questions appropriately. Cranial nerves grossly intact. Skin: No rashes or petechiae noted. Psych: Normal affect. Lymphatics: No cervical, clavicular LAD.   LAB RESULTS:  Appointment on 02/08/2016  Component Date Value Ref Range Status  . WBC 02/08/2016 7.3  3.8 - 10.6 K/uL Final  . RBC 02/08/2016 4.66  4.40 - 5.90 MIL/uL Final  .  Hemoglobin 02/08/2016 14.3  13.0 - 18.0 g/dL Final  . HCT 02/08/2016 41.6  40.0 - 52.0 % Final  . MCV 02/08/2016 89.2  80.0 - 100.0 fL Final  . MCH 02/08/2016 30.7  26.0 - 34.0 pg Final  . MCHC 02/08/2016 34.4  32.0 - 36.0 g/dL Final  . RDW 02/08/2016 14.6* 11.5 - 14.5 % Final  . Platelets 02/08/2016 159  150 - 440 K/uL Final  . Neutrophils Relative % 02/08/2016 44   Final  . Neutro Abs 02/08/2016 3.3  1.4 - 6.5 K/uL Final  . Lymphocytes Relative 02/08/2016 43   Final  . Lymphs Abs 02/08/2016 3.1  1.0 - 3.6 K/uL Final  . Monocytes Relative 02/08/2016 9   Final  . Monocytes Absolute 02/08/2016 0.7  0.2 - 1.0 K/uL Final  . Eosinophils Relative 02/08/2016 3   Final  . Eosinophils Absolute 02/08/2016 0.2  0 - 0.7 K/uL Final  . Basophils Relative 02/08/2016 1   Final  . Basophils Absolute 02/08/2016 0.1  0 - 0.1 K/uL Final  . Creatinine, Ser 02/08/2016 1.15  0.61 - 1.24 mg/dL Final  . GFR calc non  Af Amer 02/08/2016 >60  >60 mL/min Final  . GFR calc Af Amer 02/08/2016 >60  >60 mL/min Final   Comment: (NOTE) The eGFR has been calculated using the CKD EPI equation. This calculation has not been validated in all clinical situations. eGFR's persistently <60 mL/min signify possible Chronic Kidney Disease.   Marland Kitchen LDH 02/08/2016 143  98 - 192 U/L Final    STUDIES: No results found.  ASSESSMENT:  Rai stage 0 CLL.  PLAN:   1. CLL. Clinically there is no evidence of active disease. The patient's WBC count remains within the normal range as well as hemoglobin and platelets. Discussed again at length the patient remains an early stage CLL and no treatment is needed at this time. As he continues to follow with his primary care provider at least twice per year we will continue to follow him every 12 months. Patient is requesting to follow-up with Dr. Grayland Ormond.  Patient expressed understanding and was in agreement with this plan. He also understands that He can call clinic at any time with any  questions, concerns, or complaints.   Dr. Grayland Ormond was available for consultation and review of plan of care for this patient.  Evlyn Kanner, NP   02/08/2016 3:22 PM

## 2017-02-02 ENCOUNTER — Other Ambulatory Visit: Payer: Self-pay

## 2017-02-02 DIAGNOSIS — C9110 Chronic lymphocytic leukemia of B-cell type not having achieved remission: Secondary | ICD-10-CM

## 2017-02-02 NOTE — Progress Notes (Signed)
[place]  Telephone:(336) [phone] Fax:(336) [phone]  ID: Colin Gutierrez OB: June 27, 1946  MR#: [id number]  YIF#:[id number]  Patient Care Team: Madelyn Brunner, MD as PCP - General (Internal Medicine)  CHIEF COMPLAINT: CLL  INTERVAL HISTORY: Patient is 71 year old male who was previously evaluated by another provider returns for yearly follow-up for his CLL. He continues to feel well and remains asymptomatic. He denies any fevers, night sweats, or weight loss. He has no neurologic complaints. He denies any chest pain or shortness of breath. He has no nausea, vomiting, constipation, or diarrhea. He has no urinary complaints. Patient feels at his baseline and offers no specific complaints today.  REVIEW OF SYSTEMS:   Review of Systems  Constitutional: Negative.  Negative for fever, malaise/fatigue and weight loss.  Respiratory: Negative.  Negative for cough and shortness of breath.   Cardiovascular: Negative.  Negative for chest pain and leg swelling.  Gastrointestinal: Negative.  Negative for abdominal pain and melena.  Genitourinary: Negative.   Musculoskeletal: Negative.   Skin: Negative.  Negative for rash.  Neurological: Negative.  Negative for weakness.  Psychiatric/Behavioral: Negative.  The patient is not nervous/anxious.     As per HPI. Otherwise, a complete review of systems is negative.  PAST MEDICAL HISTORY: Past Medical History:  Diagnosis Date  . Arthritis   . Cancer ([place])    leukemia  . CLL (chronic lymphocytic leukemia) ([place]) 02/08/2016  . Coronary artery disease   . Diabetes mellitus without complication ([place])   . Heart murmur   . History of hiatal hernia   . Hypertension     PAST SURGICAL HISTORY: Past Surgical History:  Procedure Laterality Date  . ASD REPAIR     age 16  . CARDIAC ELECTROPHYSIOLOGY STUDY AND ABLATION     1995  . HERNIA REPAIR    . JOINT REPLACEMENT    . TOTAL KNEE ARTHROPLASTY Left 12/02/2014   Procedure: TOTAL  KNEE ARTHROPLASTY- computer assisted;  Surgeon: Dereck Leep, MD;  Location: [facility] ORS;  Service: Orthopedics;  Laterality: Left;    FAMILY HISTORY: Reviewed and unchanged. Daughter with stage IV colon cancer otherwise negative and noncontributory.  ADVANCED DIRECTIVES (Y/N):  N  HEALTH MAINTENANCE: Social History  Substance Use Topics  . Smoking status: Former Research scientist (life sciences)  . Smokeless tobacco: Never Used  . Alcohol use 1.2 oz/week    2 Glasses of wine per week     Colonoscopy:  PAP:  Bone density:  Lipid panel:  No Known Allergies  Current Outpatient Prescriptions  Medication Sig Dispense Refill  . allopurinol (ZYLOPRIM) 100 MG tablet Take 100 mg by mouth daily.    Marland Kitchen aspirin EC 81 MG tablet Take 81 mg by mouth daily.    . cephALEXin (KEFLEX) 500 MG capsule Reported on 02/08/2016  0  . ferrous sulfate 325 (65 FE) MG tablet Take 1 tablet (325 mg total) by mouth 2 (two) times daily with a meal. 30 tablet 0  . gemfibrozil (LOPID) 600 MG tablet Take 600 mg by mouth 2 (two) times daily before a meal.    . Glucosamine-Chondroitin 250-200 MG TABS Take by mouth.    . hydrochlorothiazide (HYDRODIURIL) 25 MG tablet Take 25 mg by mouth daily. Reported on 02/08/2016    . HYDROcodone-acetaminophen (NORCO/VICODIN) 5-325 MG tablet Take by mouth. Reported on 02/08/2016    . metFORMIN (GLUCOPHAGE) 1000 MG tablet Take 1,000 mg by mouth 2 (two) times daily with a meal. Reported on 02/08/2016    .  metoprolol succinate (TOPROL-XL) 50 MG 24 hr tablet Take 50 mg by mouth daily. Take with or immediately following a meal.    . oxyCODONE (OXY IR/ROXICODONE) 5 MG immediate release tablet Take 1-2 tablets (5-10 mg total) by mouth every 4 (four) hours as needed for breakthrough pain. 80 tablet 0  . sildenafil (REVATIO) 20 MG tablet Reported on 02/08/2016    . silver sulfADIAZINE (SILVADENE) 1 % cream Reported on 02/08/2016  0   No current facility-administered medications for this visit.     OBJECTIVE: Vitals:     02/06/17 1417  BP: (!) 158/88  Pulse: (!) 53  Resp: 20  Temp: 98.4 F (36.9 C)     Body mass index is 30.07 kg/m.    ECOG FS:0 - Asymptomatic  General: Well-developed, well-nourished, no acute distress. Eyes: Pink conjunctiva, anicteric sclera. HEENT: Normocephalic, moist mucous membranes, clear oropharnyx. Lungs: Clear to auscultation bilaterally. Heart: Regular rate and rhythm. No rubs, murmurs, or gallops. Abdomen: Soft, nontender, nondistended. No organomegaly noted, normoactive bowel sounds. Musculoskeletal: No edema, cyanosis, or clubbing. Neuro: Alert, answering all questions appropriately. Cranial nerves grossly intact. Skin: No rashes or petechiae noted. Psych: Normal affect. Lymphatics: No cervical, calvicular, axillary or inguinal LAD.   LAB RESULTS:  Lab Results  Component Value Date   NA 137 02/06/2017   K 3.8 02/06/2017   CL 101 02/06/2017   CO2 24 02/06/2017   GLUCOSE 146 (H) 02/06/2017   BUN 29 (H) 02/06/2017   CREATININE 1.27 (H) 02/06/2017   CALCIUM 9.6 02/06/2017   PROT 8.0 02/06/2017   ALBUMIN 4.5 02/06/2017   AST 27 02/06/2017   ALT 13 (L) 02/06/2017   ALKPHOS 48 02/06/2017   BILITOT 0.7 02/06/2017   GFRNONAA 54 (L) 02/06/2017   GFRAA >60 02/06/2017    Lab Results  Component Value Date   WBC 8.5 02/06/2017   NEUTROABS 3.8 02/06/2017   HGB 14.2 02/06/2017   HCT 41.2 02/06/2017   MCV 89.2 02/06/2017   PLT 157 02/06/2017     STUDIES: No results found.  ASSESSMENT: CLL, Rai stage 0  PLAN:    1. CLL: Patient's white blood cell continues to remain within normal limits. He does have an elevated lymphocyte level on differential. Patient has no other cytopenias are lymphadenopathy. No intervention is needed at this time. He does not require bone marrow biopsy. Will continue with simple observation and yearly follow-up. Plan to repeat peripheral blood flow cytometry at next clinic visit. 2. Renal insufficiency: Creatinine mildly elevated  today, monitor. 3. Hypertension: Blood pressure mildly elevated today, monitor.  Patient expressed understanding and was in agreement with this plan. He also understands that He can call clinic at any time with any questions, concerns, or complaints.    Lloyd Huger, MD   02/07/2017 1:36 PM

## 2017-02-06 ENCOUNTER — Inpatient Hospital Stay: Payer: Medicare Other | Attending: Oncology

## 2017-02-06 ENCOUNTER — Inpatient Hospital Stay (HOSPITAL_BASED_OUTPATIENT_CLINIC_OR_DEPARTMENT_OTHER): Payer: Medicare Other | Admitting: Oncology

## 2017-02-06 VITALS — BP 158/88 | HR 53 | Temp 98.4°F | Resp 20 | Wt 227.9 lb

## 2017-02-06 DIAGNOSIS — I10 Essential (primary) hypertension: Secondary | ICD-10-CM | POA: Insufficient documentation

## 2017-02-06 DIAGNOSIS — E119 Type 2 diabetes mellitus without complications: Secondary | ICD-10-CM | POA: Diagnosis not present

## 2017-02-06 DIAGNOSIS — Z7984 Long term (current) use of oral hypoglycemic drugs: Secondary | ICD-10-CM | POA: Insufficient documentation

## 2017-02-06 DIAGNOSIS — C9110 Chronic lymphocytic leukemia of B-cell type not having achieved remission: Secondary | ICD-10-CM | POA: Diagnosis present

## 2017-02-06 DIAGNOSIS — Z87891 Personal history of nicotine dependence: Secondary | ICD-10-CM

## 2017-02-06 DIAGNOSIS — K449 Diaphragmatic hernia without obstruction or gangrene: Secondary | ICD-10-CM

## 2017-02-06 DIAGNOSIS — R011 Cardiac murmur, unspecified: Secondary | ICD-10-CM | POA: Diagnosis not present

## 2017-02-06 DIAGNOSIS — M129 Arthropathy, unspecified: Secondary | ICD-10-CM

## 2017-02-06 DIAGNOSIS — I2510 Atherosclerotic heart disease of native coronary artery without angina pectoris: Secondary | ICD-10-CM

## 2017-02-06 DIAGNOSIS — Z79899 Other long term (current) drug therapy: Secondary | ICD-10-CM

## 2017-02-06 DIAGNOSIS — Z7982 Long term (current) use of aspirin: Secondary | ICD-10-CM | POA: Insufficient documentation

## 2017-02-06 LAB — COMPREHENSIVE METABOLIC PANEL
ALBUMIN: 4.5 g/dL (ref 3.5–5.0)
ALT: 13 U/L — AB (ref 17–63)
AST: 27 U/L (ref 15–41)
Alkaline Phosphatase: 48 U/L (ref 38–126)
Anion gap: 12 (ref 5–15)
BILIRUBIN TOTAL: 0.7 mg/dL (ref 0.3–1.2)
BUN: 29 mg/dL — AB (ref 6–20)
CHLORIDE: 101 mmol/L (ref 101–111)
CO2: 24 mmol/L (ref 22–32)
CREATININE: 1.27 mg/dL — AB (ref 0.61–1.24)
Calcium: 9.6 mg/dL (ref 8.9–10.3)
GFR calc Af Amer: >60 mL/min (ref >60)
GFR, EST NON AFRICAN AMERICAN: 54 mL/min — AB (ref >60)
GLUCOSE: 146 mg/dL — AB (ref 65–99)
POTASSIUM: 3.8 mmol/L (ref 3.5–5.1)
Sodium: 137 mmol/L (ref 135–145)
Total Protein: 8 g/dL (ref 6.5–8.1)

## 2017-02-06 LAB — CBC WITH DIFFERENTIAL/PLATELET
BASOS ABS: 0.1 10*3/uL (ref 0–0.1)
BASOS PCT: 1 %
Eosinophils Absolute: 0.2 10*3/uL (ref 0–0.7)
Eosinophils Relative: 3 %
HEMATOCRIT: 41.2 % (ref 40.0–52.0)
Hemoglobin: 14.2 g/dL (ref 13.0–18.0)
Lymphocytes Relative: 43 %
Lymphs Abs: 3.7 10*3/uL — ABNORMAL HIGH (ref 1.0–3.6)
MCH: 30.7 pg (ref 26.0–34.0)
MCHC: 34.4 g/dL (ref 32.0–36.0)
MCV: 89.2 fL (ref 80.0–100.0)
Monocytes Absolute: 0.7 10*3/uL (ref 0.2–1.0)
Monocytes Relative: 9 %
NEUTROS ABS: 3.8 10*3/uL (ref 1.4–6.5)
NEUTROS PCT: 44 %
Platelets: 157 10*3/uL (ref 150–440)
RBC: 4.62 MIL/uL (ref 4.40–5.90)
RDW: 14.5 % (ref 11.5–14.5)
WBC: 8.5 10*3/uL (ref 3.8–10.6)

## 2017-02-06 LAB — LACTATE DEHYDROGENASE: LDH: 141 U/L (ref 98–192)

## 2017-02-06 NOTE — Progress Notes (Signed)
Patient denies any concerns today.  

## 2017-02-08 ENCOUNTER — Other Ambulatory Visit: Payer: Self-pay | Admitting: *Deleted

## 2017-02-08 DIAGNOSIS — C9110 Chronic lymphocytic leukemia of B-cell type not having achieved remission: Secondary | ICD-10-CM

## 2018-02-03 NOTE — Progress Notes (Deleted)
[name]  [phone] [phone]) [phone]  ID: Colin Gutierrez OB: [birth year]  MR#: [id number]  YBO#:[id number]  Patient Care Team: Colin Brunner, MD as PCP - General (Internal Medicine)  CHIEF COMPLAINT: CLL  INTERVAL HISTORY: Patient is 72 year old male who was previously evaluated by another provider returns for yearly follow-up for his CLL. He continues to feel well and remains asymptomatic. He denies any fevers, night sweats, or weight loss. He has no neurologic complaints. He denies any chest pain or shortness of breath. He has no nausea, vomiting, constipation, or diarrhea. He has no urinary complaints. Patient feels at his baseline and offers no specific complaints today.  REVIEW OF SYSTEMS:   Review of Systems  Constitutional: Negative.  Negative for fever, malaise/fatigue and weight loss.  Respiratory: Negative.  Negative for cough and shortness of breath.   Cardiovascular: Negative.  Negative for chest pain and leg swelling.  Gastrointestinal: Negative.  Negative for abdominal pain and melena.  Genitourinary: Negative.   Musculoskeletal: Negative.   Skin: Negative.  Negative for rash.  Neurological: Negative.  Negative for weakness.  Psychiatric/Behavioral: Negative.  The patient is not nervous/anxious.     As per HPI. Otherwise, a complete review of systems is negative.  PAST MEDICAL HISTORY: Past Medical History:  Diagnosis Date  . Arthritis   . Cancer ([place])    leukemia  . 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 Coronary artery disease   . Diabetes mellitus without complication ([place])   . Heart murmur   . History of hiatal hernia   . Hypertension     PAST SURGICAL HISTORY: Past Surgical History:  Procedure Laterality Date  . ASD REPAIR     age 5  . CARDIAC ELECTROPHYSIOLOGY STUDY AND ABLATION     1995  . HERNIA REPAIR    . JOINT REPLACEMENT    . TOTAL KNEE ARTHROPLASTY Left 12/02/2014   Procedure: TOTAL  KNEE ARTHROPLASTY- computer assisted;  Surgeon: Colin Leep, MD;  Location: [facility] ORS;  Service: Orthopedics;  Laterality: Left;    FAMILY HISTORY: Reviewed and unchanged. Daughter with stage IV colon cancer otherwise negative and noncontributory.  ADVANCED DIRECTIVES (Y/N):  N  HEALTH MAINTENANCE: Social History   Tobacco Use  . Smoking status: Former Research scientist (life sciences)  . Smokeless tobacco: Never Used  Substance Use Topics  . Alcohol use: Yes    Alcohol/week: 1.2 oz    Types: 2 Glasses of wine per week  . Drug use: Not on file     Colonoscopy:  PAP:  Bone density:  Lipid panel:  No Known Allergies  Current Outpatient Medications  Medication Sig Dispense Refill  . allopurinol (ZYLOPRIM) 100 MG tablet Take 100 mg by mouth daily.    Marland Kitchen aspirin EC 81 MG tablet Take 81 mg by mouth daily.    . cephALEXin (KEFLEX) 500 MG capsule Reported on 02/08/2016  0  . ferrous sulfate 325 (65 FE) MG tablet Take 1 tablet (325 mg total) by mouth 2 (two) times daily with a meal. 30 tablet 0  . gemfibrozil (LOPID) 600 MG tablet Take 600 mg by mouth 2 (two) times daily before a meal.    . Glucosamine-Chondroitin 250-200 MG TABS Take by mouth.    . hydrochlorothiazide (HYDRODIURIL) 25 MG tablet Take 25 mg by mouth daily. Reported on 02/08/2016    . HYDROcodone-acetaminophen (NORCO/VICODIN) 5-325 MG tablet Take by mouth. Reported on 02/08/2016    . metFORMIN (GLUCOPHAGE) 1000 MG tablet Take 1,000  mg by mouth 2 (two) times daily with a meal. Reported on 02/08/2016    . metoprolol succinate (TOPROL-XL) 50 MG 24 hr tablet Take 50 mg by mouth daily. Take with or immediately following a meal.    . oxyCODONE (OXY IR/ROXICODONE) 5 MG immediate release tablet Take 1-2 tablets (5-10 mg total) by mouth every 4 (four) hours as needed for breakthrough pain. 80 tablet 0  . sildenafil (REVATIO) 20 MG tablet Reported on 02/08/2016    . silver sulfADIAZINE (SILVADENE) 1 % cream Reported on 02/08/2016  0   No current  facility-administered medications for this visit.     OBJECTIVE: There were no vitals filed for this visit.   There is no height or weight on file to calculate BMI.    ECOG FS:0 - Asymptomatic  General: Well-developed, well-nourished, no acute distress. Eyes: Pink conjunctiva, anicteric sclera. HEENT: Normocephalic, moist mucous membranes, clear oropharnyx. Lungs: Clear to auscultation bilaterally. Heart: Regular rate and rhythm. No rubs, murmurs, or gallops. Abdomen: Soft, nontender, nondistended. No organomegaly noted, normoactive bowel sounds. Musculoskeletal: No edema, cyanosis, or clubbing. Neuro: Alert, answering all questions appropriately. Cranial nerves grossly intact. Skin: No rashes or petechiae noted. Psych: Normal affect. Lymphatics: No cervical, calvicular, axillary or inguinal LAD.   LAB RESULTS:  Lab Results  Component Value Date   NA 137 02/06/2017   K 3.8 02/06/2017   CL 101 02/06/2017   CO2 24 02/06/2017   GLUCOSE 146 (H) 02/06/2017   BUN 29 (H) 02/06/2017   CREATININE 1.27 (H) 02/06/2017   CALCIUM 9.6 02/06/2017   PROT 8.0 02/06/2017   ALBUMIN 4.5 02/06/2017   AST 27 02/06/2017   ALT 13 (L) 02/06/2017   ALKPHOS 48 02/06/2017   BILITOT 0.7 02/06/2017   GFRNONAA 54 (L) 02/06/2017   GFRAA >60 02/06/2017    Lab Results  Component Value Date   WBC 8.5 02/06/2017   NEUTROABS 3.8 02/06/2017   HGB 14.2 02/06/2017   HCT 41.2 02/06/2017   MCV 89.2 02/06/2017   PLT 157 02/06/2017     STUDIES: No results found.  ASSESSMENT: CLL, Rai stage 0  PLAN:    1. CLL: Patient's white blood cell continues to remain within normal limits. He does have an elevated lymphocyte level on differential. Patient has no other cytopenias are lymphadenopathy. No intervention is needed at this time. He does not require bone marrow biopsy. Will continue with simple observation and yearly follow-up. Plan to repeat peripheral blood flow cytometry at next clinic visit. 2. Renal  insufficiency: Creatinine mildly elevated today, monitor. 3. Hypertension: Blood pressure mildly elevated today, monitor.  Patient expressed understanding and was in agreement with this plan. He also understands that He can call clinic at any time with any questions, concerns, or complaints.    Lloyd Huger, MD   02/03/2018 8:02 AM

## 2018-02-05 ENCOUNTER — Inpatient Hospital Stay: Payer: Medicare Other

## 2018-02-05 ENCOUNTER — Inpatient Hospital Stay: Payer: Medicare Other | Admitting: Oncology

## 2018-05-14 ENCOUNTER — Other Ambulatory Visit: Payer: Self-pay | Admitting: Internal Medicine

## 2018-05-14 DIAGNOSIS — R0602 Shortness of breath: Secondary | ICD-10-CM

## 2018-05-16 ENCOUNTER — Ambulatory Visit
Admission: RE | Admit: 2018-05-16 | Discharge: 2018-05-16 | Disposition: A | Discharge status: Home / Self Care | Payer: Medicare Other | Source: Ambulatory Visit | Attending: Internal Medicine | Admitting: Internal Medicine

## 2018-05-16 ENCOUNTER — Other Ambulatory Visit
Admission: RE | Admit: 2018-05-16 | Discharge: 2018-05-16 | Disposition: A | Discharge status: Home / Self Care | Payer: Medicare Other | Source: Ambulatory Visit | Attending: Internal Medicine | Admitting: Internal Medicine

## 2018-05-16 DIAGNOSIS — I517 Cardiomegaly: Secondary | ICD-10-CM | POA: Insufficient documentation

## 2018-05-16 DIAGNOSIS — I700 Atherosclerosis of aorta: Secondary | ICD-10-CM | POA: Diagnosis not present

## 2018-05-16 DIAGNOSIS — R0602 Shortness of breath: Secondary | ICD-10-CM | POA: Diagnosis present

## 2018-05-16 LAB — CREATININE, SERUM
Creatinine, Ser: 1.29 mg/dL — ABNORMAL HIGH (ref 0.61–1.24)
GFR calc Af Amer: >60 mL/min
GFR calc non Af Amer: 53 mL/min — ABNORMAL LOW

## 2018-05-16 MED ORDER — IOHEXOL 300 MG/ML  SOLN
75.0000 mL | Freq: Once | INTRAMUSCULAR | Status: AC | PRN
  Administered 2018-05-16: 75 mL via INTRAVENOUS

## 2018-05-17 ENCOUNTER — Other Ambulatory Visit: Payer: Self-pay | Admitting: Internal Medicine

## 2018-05-17 DIAGNOSIS — K7460 Unspecified cirrhosis of liver: Secondary | ICD-10-CM

## 2018-05-21 ENCOUNTER — Other Ambulatory Visit: Payer: Self-pay | Admitting: Gastroenterology

## 2018-05-21 DIAGNOSIS — K7460 Unspecified cirrhosis of liver: Secondary | ICD-10-CM

## 2018-05-23 ENCOUNTER — Ambulatory Visit: Payer: Medicare Other

## 2018-05-24 ENCOUNTER — Ambulatory Visit
Admission: RE | Admit: 2018-05-24 | Discharge: 2018-05-24 | Disposition: A | Discharge status: Home / Self Care | Payer: Medicare Other | Source: Ambulatory Visit | Attending: Gastroenterology | Admitting: Gastroenterology

## 2018-05-24 DIAGNOSIS — K7460 Unspecified cirrhosis of liver: Secondary | ICD-10-CM | POA: Insufficient documentation

## 2018-06-18 ENCOUNTER — Other Ambulatory Visit: Payer: Medicare Other

## 2018-06-18 ENCOUNTER — Ambulatory Visit: Payer: Medicare Other | Admitting: Oncology

## 2018-06-30 NOTE — Progress Notes (Signed)
[place]  [phone] Fax:(336) [phone]  ID: Colin Gutierrez OB: 05-14-1946  MR#: [id number]  QIO#:[id number]  Patient Care Team: Baxter Hire, MD as PCP - General (Internal Medicine)  CHIEF COMPLAINT: CLL  INTERVAL HISTORY: Patient returns to clinic today for repeat laboratory work and routine yearly evaluation.  He continues to feel well and remains asymptomatic.  He denies any fevers, night sweats, or weight loss. He has no neurologic complaints. He denies any chest pain or shortness of breath. He has no nausea, vomiting, constipation, or diarrhea. He has no urinary complaints.  Patient feels at his baseline offers no specific complaints today.  REVIEW OF SYSTEMS:   Review of Systems  Constitutional: Negative.  Negative for fever, malaise/fatigue and weight loss.  Respiratory: Negative.  Negative for cough and shortness of breath.   Cardiovascular: Negative.  Negative for chest pain and leg swelling.  Gastrointestinal: Negative.  Negative for abdominal pain and melena.  Genitourinary: Negative.  Negative for dysuria.  Musculoskeletal: Negative.  Negative for back pain.  Skin: Negative.  Negative for rash.  Neurological: Negative.  Negative for focal weakness, weakness and headaches.  Psychiatric/Behavioral: Negative.  The patient is not nervous/anxious.     As per HPI. Otherwise, a complete review of systems is negative.  PAST MEDICAL HISTORY: Past Medical History:  Diagnosis Date  . Arthritis   . Cancer ([place])    leukemia  . CLL (chronic lymphocytic leukemia) ([place]) 02/08/2016  . Coronary artery disease   . Diabetes mellitus without complication ([place])   . Heart murmur   . History of hiatal hernia   . Hypertension     PAST SURGICAL HISTORY: Past Surgical History:  Procedure Laterality Date  . ASD REPAIR     age 56  . CARDIAC ELECTROPHYSIOLOGY STUDY AND ABLATION     1995  . HERNIA REPAIR    . JOINT REPLACEMENT    . TOTAL KNEE  ARTHROPLASTY Left 12/02/2014   Procedure: TOTAL KNEE ARTHROPLASTY- computer assisted;  Surgeon: Dereck Leep, MD;  Location: [facility] ORS;  Service: Orthopedics;  Laterality: Left;    FAMILY HISTORY: Reviewed and unchanged. Daughter with stage IV colon cancer otherwise negative and noncontributory.  ADVANCED DIRECTIVES (Y/N):  N  HEALTH MAINTENANCE: Social History   Tobacco Use  . Smoking status: Former Research scientist (life sciences)  . Smokeless tobacco: Never Used  Substance Use Topics  . Alcohol use: Yes    Alcohol/week: 2.0 standard drinks    Types: 2 Glasses of wine per week  . Drug use: Not on file     Colonoscopy:  PAP:  Bone density:  Lipid panel:  No Known Allergies  Current Outpatient Medications  Medication Sig Dispense Refill  . allopurinol (ZYLOPRIM) 100 MG tablet Take 100 mg by mouth daily.    Marland Kitchen aspirin EC 81 MG tablet Take 81 mg by mouth daily.    . cephALEXin (KEFLEX) 500 MG capsule Reported on 02/08/2016  0  . ferrous sulfate 325 (65 FE) MG tablet Take 1 tablet (325 mg total) by mouth 2 (two) times daily with a meal. 30 tablet 0  . furosemide (LASIX) 20 MG tablet TAKE ONE TABLET BY MOUTH EVERY DAY    . gemfibrozil (LOPID) 600 MG tablet Take 600 mg by mouth 2 (two) times daily before a meal.    . Glucosamine-Chondroitin 250-200 MG TABS Take by mouth.    . hydrochlorothiazide (HYDRODIURIL) 25 MG tablet Take 25 mg by mouth daily. Reported on 02/08/2016    .  HYDROcodone-acetaminophen (NORCO/VICODIN) 5-325 MG tablet Take by mouth. Reported on 02/08/2016    . lisinopril (PRINIVIL,ZESTRIL) 2.5 MG tablet     . metFORMIN (GLUCOPHAGE) 1000 MG tablet Take 1,000 mg by mouth 2 (two) times daily with a meal. Reported on 02/08/2016    . metoprolol succinate (TOPROL-XL) 50 MG 24 hr tablet Take 50 mg by mouth daily. Take with or immediately following a meal.    . oxyCODONE (OXY IR/ROXICODONE) 5 MG immediate release tablet Take 1-2 tablets (5-10 mg total) by mouth every 4 (four) hours as needed for  breakthrough pain. 80 tablet 0  . sildenafil (REVATIO) 20 MG tablet Reported on 02/08/2016    . silver sulfADIAZINE (SILVADENE) 1 % cream Reported on 02/08/2016  0   No current facility-administered medications for this visit.     OBJECTIVE: Vitals:   07/02/18 1022  BP: (!) 145/78  Pulse: 80  Temp: 97.8 F (36.6 C)     Body mass index is 27.98 kg/m.    ECOG FS:0 - Asymptomatic  General: Well-developed, well-nourished, no acute distress. Eyes: Pink conjunctiva, anicteric sclera. HEENT: Normocephalic, moist mucous membranes, clear oropharnyx. Lungs: Clear to auscultation bilaterally. Heart: Regular rate and rhythm. No rubs, murmurs, or gallops. Abdomen: Soft, nontender, nondistended. No organomegaly noted, normoactive bowel sounds. Musculoskeletal: No edema, cyanosis, or clubbing. Neuro: Alert, answering all questions appropriately. Cranial nerves grossly intact. Skin: No rashes or petechiae noted. Psych: Normal affect. Lymphatics: No cervical, calvicular, axillary or inguinal LAD.   LAB RESULTS:  Lab Results  Component Value Date   NA 137 02/06/2017   K 3.8 02/06/2017   CL 101 02/06/2017   CO2 24 02/06/2017   GLUCOSE 146 (H) 02/06/2017   BUN 29 (H) 02/06/2017   CREATININE 1.29 (H) 05/16/2018   CALCIUM 9.6 02/06/2017   PROT 8.0 02/06/2017   ALBUMIN 4.5 02/06/2017   AST 27 02/06/2017   ALT 13 (L) 02/06/2017   ALKPHOS 48 02/06/2017   BILITOT 0.7 02/06/2017   GFRNONAA 53 (L) 05/16/2018   GFRAA >60 05/16/2018    Lab Results  Component Value Date   WBC 8.9 07/02/2018   NEUTROABS 4.3 07/02/2018   HGB 14.1 07/02/2018   HCT 42.8 07/02/2018   MCV 92.8 07/02/2018   PLT 158 07/02/2018     STUDIES: No results found.  ASSESSMENT: CLL, Rai stage 0  PLAN:    1. CLL: Patient's white blood cell count continues to be within normal limits.  He has no other cytopenias or lymphadenopathy.  I have ordered CLL FISH panel as well as repeat peripheral blood flow cytometry  today.  These are pending at time of dictation.  No intervention is needed at this time.  Patient does not require bone marrow biopsy.  Continue simple observation.  Return to clinic in 1 year for repeat laboratory work and further evaluation.    I spent a total of 20 minutes face-to-face with the patient of which greater than 50% of the visit was spent in counseling and coordination of care as detailed above.   Patient expressed understanding and was in agreement with this plan. He also understands that He can call clinic at any time with any questions, concerns, or complaints.    Lloyd Huger, MD   07/04/2018 5:35 PM

## 2018-07-01 ENCOUNTER — Other Ambulatory Visit: Payer: Self-pay | Admitting: *Deleted

## 2018-07-01 DIAGNOSIS — C9110 Chronic lymphocytic leukemia of B-cell type not having achieved remission: Secondary | ICD-10-CM

## 2018-07-02 ENCOUNTER — Other Ambulatory Visit: Payer: Self-pay

## 2018-07-02 ENCOUNTER — Inpatient Hospital Stay (HOSPITAL_BASED_OUTPATIENT_CLINIC_OR_DEPARTMENT_OTHER): Payer: Medicare Other | Admitting: Oncology

## 2018-07-02 ENCOUNTER — Inpatient Hospital Stay: Payer: Medicare Other | Attending: Oncology

## 2018-07-02 VITALS — BP 145/78 | HR 80 | Temp 97.8°F | Wt 212.1 lb

## 2018-07-02 DIAGNOSIS — Z79899 Other long term (current) drug therapy: Secondary | ICD-10-CM | POA: Insufficient documentation

## 2018-07-02 DIAGNOSIS — Z87891 Personal history of nicotine dependence: Secondary | ICD-10-CM | POA: Diagnosis not present

## 2018-07-02 DIAGNOSIS — C9110 Chronic lymphocytic leukemia of B-cell type not having achieved remission: Secondary | ICD-10-CM | POA: Diagnosis not present

## 2018-07-02 LAB — CBC WITH DIFFERENTIAL/PLATELET
Abs Immature Granulocytes: 0.03 10*3/uL (ref 0.00–0.07)
BASOS ABS: 0.1 10*3/uL (ref 0.0–0.1)
Basophils Relative: 1 %
EOS ABS: 0.2 10*3/uL (ref 0.0–0.5)
Eosinophils Relative: 2 %
HCT: 42.8 % (ref 39.0–52.0)
Hemoglobin: 14.1 g/dL (ref 13.0–17.0)
Immature Granulocytes: 0 %
LYMPHS PCT: 41 %
Lymphs Abs: 3.6 10*3/uL (ref 0.7–4.0)
MCH: 30.6 pg (ref 26.0–34.0)
MCHC: 32.9 g/dL (ref 30.0–36.0)
MCV: 92.8 fL (ref 80.0–100.0)
Monocytes Absolute: 0.7 10*3/uL (ref 0.1–1.0)
Monocytes Relative: 8 %
NRBC: 0 % (ref 0.0–0.2)
Neutro Abs: 4.3 10*3/uL (ref 1.7–7.7)
Neutrophils Relative %: 48 %
PLATELETS: 158 10*3/uL (ref 150–400)
RBC: 4.61 MIL/uL (ref 4.22–5.81)
RDW: 15.3 % (ref 11.5–15.5)
WBC: 8.9 10*3/uL (ref 4.0–10.5)

## 2018-07-02 NOTE — Progress Notes (Signed)
Patient is here to follow up on his CLL. Patient stated that he is doing well with no complaints.

## 2018-07-10 LAB — MISC LABCORP TEST (SEND OUT): LABCORP TEST CODE: 113753

## 2018-07-11 LAB — MISC LABCORP TEST (SEND OUT): LABCORP TEST CODE: 510340

## 2018-09-04 ENCOUNTER — Encounter: Payer: Self-pay | Admitting: *Deleted

## 2018-09-05 ENCOUNTER — Ambulatory Visit
Admission: RE | Admit: 2018-09-05 | Discharge: 2018-09-05 | Disposition: A | Discharge status: Home / Self Care | Payer: Medicare Other | Attending: Gastroenterology | Admitting: Gastroenterology

## 2018-09-05 ENCOUNTER — Encounter: Payer: Self-pay | Admitting: *Deleted

## 2018-09-05 ENCOUNTER — Other Ambulatory Visit: Payer: Self-pay

## 2018-09-05 ENCOUNTER — Ambulatory Visit: Payer: Medicare Other | Admitting: Anesthesiology

## 2018-09-05 ENCOUNTER — Encounter
Admission: RE | Disposition: A | Discharge status: Home / Self Care | Payer: Self-pay | Source: Home / Self Care | Attending: Gastroenterology

## 2018-09-05 DIAGNOSIS — K210 Gastro-esophageal reflux disease with esophagitis: Secondary | ICD-10-CM | POA: Diagnosis not present

## 2018-09-05 DIAGNOSIS — K7460 Unspecified cirrhosis of liver: Secondary | ICD-10-CM | POA: Insufficient documentation

## 2018-09-05 DIAGNOSIS — Z7984 Long term (current) use of oral hypoglycemic drugs: Secondary | ICD-10-CM | POA: Diagnosis not present

## 2018-09-05 DIAGNOSIS — K279 Peptic ulcer, site unspecified, unspecified as acute or chronic, without hemorrhage or perforation: Secondary | ICD-10-CM | POA: Insufficient documentation

## 2018-09-05 DIAGNOSIS — Z87891 Personal history of nicotine dependence: Secondary | ICD-10-CM | POA: Insufficient documentation

## 2018-09-05 DIAGNOSIS — Z7982 Long term (current) use of aspirin: Secondary | ICD-10-CM | POA: Insufficient documentation

## 2018-09-05 DIAGNOSIS — Z87442 Personal history of urinary calculi: Secondary | ICD-10-CM | POA: Diagnosis not present

## 2018-09-05 DIAGNOSIS — I110 Hypertensive heart disease with heart failure: Secondary | ICD-10-CM | POA: Diagnosis not present

## 2018-09-05 DIAGNOSIS — Z96652 Presence of left artificial knee joint: Secondary | ICD-10-CM | POA: Diagnosis not present

## 2018-09-05 DIAGNOSIS — I4891 Unspecified atrial fibrillation: Secondary | ICD-10-CM | POA: Insufficient documentation

## 2018-09-05 DIAGNOSIS — C9110 Chronic lymphocytic leukemia of B-cell type not having achieved remission: Secondary | ICD-10-CM | POA: Diagnosis not present

## 2018-09-05 DIAGNOSIS — I509 Heart failure, unspecified: Secondary | ICD-10-CM | POA: Diagnosis not present

## 2018-09-05 DIAGNOSIS — Z79899 Other long term (current) drug therapy: Secondary | ICD-10-CM | POA: Diagnosis not present

## 2018-09-05 DIAGNOSIS — K2960 Other gastritis without bleeding: Secondary | ICD-10-CM | POA: Diagnosis not present

## 2018-09-05 DIAGNOSIS — Z8619 Personal history of other infectious and parasitic diseases: Secondary | ICD-10-CM | POA: Diagnosis not present

## 2018-09-05 DIAGNOSIS — E119 Type 2 diabetes mellitus without complications: Secondary | ICD-10-CM | POA: Diagnosis not present

## 2018-09-05 DIAGNOSIS — M1990 Unspecified osteoarthritis, unspecified site: Secondary | ICD-10-CM | POA: Insufficient documentation

## 2018-09-05 DIAGNOSIS — K449 Diaphragmatic hernia without obstruction or gangrene: Secondary | ICD-10-CM | POA: Insufficient documentation

## 2018-09-05 DIAGNOSIS — I2510 Atherosclerotic heart disease of native coronary artery without angina pectoris: Secondary | ICD-10-CM | POA: Diagnosis not present

## 2018-09-05 DIAGNOSIS — I071 Rheumatic tricuspid insufficiency: Secondary | ICD-10-CM | POA: Diagnosis not present

## 2018-09-05 DIAGNOSIS — K2210 Ulcer of esophagus without bleeding: Secondary | ICD-10-CM | POA: Insufficient documentation

## 2018-09-05 HISTORY — DX: Cardiac arrhythmia, unspecified: I49.9

## 2018-09-05 HISTORY — DX: Diaphragmatic hernia without obstruction or gangrene: K44.9

## 2018-09-05 HISTORY — PX: ESOPHAGOGASTRODUODENOSCOPY: SHX5428

## 2018-09-05 HISTORY — DX: Personal history of urinary calculi: Z87.442

## 2018-09-05 HISTORY — DX: Duodenitis without bleeding: K29.80

## 2018-09-05 HISTORY — DX: Other gastritis without bleeding: K29.60

## 2018-09-05 LAB — CBC WITH DIFFERENTIAL/PLATELET
Abs Immature Granulocytes: 0.03 10*3/uL (ref 0.00–0.07)
BASOS PCT: 1 %
Basophils Absolute: 0.1 10*3/uL (ref 0.0–0.1)
Eosinophils Absolute: 0.2 10*3/uL (ref 0.0–0.5)
Eosinophils Relative: 3 %
HEMATOCRIT: 40.6 % (ref 39.0–52.0)
Hemoglobin: 13.3 g/dL (ref 13.0–17.0)
Immature Granulocytes: 0 %
LYMPHS ABS: 4.3 10*3/uL — AB (ref 0.7–4.0)
Lymphocytes Relative: 50 %
MCH: 30.4 pg (ref 26.0–34.0)
MCHC: 32.8 g/dL (ref 30.0–36.0)
MCV: 92.7 fL (ref 80.0–100.0)
MONO ABS: 0.7 10*3/uL (ref 0.1–1.0)
MONOS PCT: 8 %
NEUTROS ABS: 3.3 10*3/uL (ref 1.7–7.7)
Neutrophils Relative %: 38 %
PLATELETS: 153 10*3/uL (ref 150–400)
RBC: 4.38 MIL/uL (ref 4.22–5.81)
RDW: 15.3 % (ref 11.5–15.5)
WBC: 8.6 10*3/uL (ref 4.0–10.5)
nRBC: 0 % (ref 0.0–0.2)

## 2018-09-05 LAB — PROTIME-INR
INR: 1
Prothrombin Time: 13.1 seconds (ref 11.4–15.2)

## 2018-09-05 LAB — GLUCOSE, CAPILLARY: GLUCOSE-CAPILLARY: 130 mg/dL — AB (ref 70–99)

## 2018-09-05 SURGERY — EGD (ESOPHAGOGASTRODUODENOSCOPY)
Anesthesia: General

## 2018-09-05 MED ORDER — MIDAZOLAM HCL 2 MG/2ML IJ SOLN
INTRAMUSCULAR | Status: AC
  Filled 2018-09-05: qty 2

## 2018-09-05 MED ORDER — SODIUM CHLORIDE 0.9 % IV SOLN
INTRAVENOUS | Status: DC
  Administered 2018-09-05: 09:00:00 via INTRAVENOUS

## 2018-09-05 MED ORDER — FENTANYL CITRATE (PF) 100 MCG/2ML IJ SOLN
INTRAMUSCULAR | Status: DC | PRN
  Administered 2018-09-05: 50 ug via INTRAVENOUS

## 2018-09-05 MED ORDER — PROPOFOL 500 MG/50ML IV EMUL
INTRAVENOUS | Status: AC
  Filled 2018-09-05: qty 50

## 2018-09-05 MED ORDER — FENTANYL CITRATE (PF) 100 MCG/2ML IJ SOLN
INTRAMUSCULAR | Status: AC
  Filled 2018-09-05: qty 2

## 2018-09-05 MED ORDER — EPHEDRINE SULFATE 50 MG/ML IJ SOLN
INTRAMUSCULAR | Status: AC
  Filled 2018-09-05: qty 1

## 2018-09-05 MED ORDER — MIDAZOLAM HCL 2 MG/2ML IJ SOLN
INTRAMUSCULAR | Status: DC | PRN
  Administered 2018-09-05: 2 mg via INTRAVENOUS

## 2018-09-05 MED ORDER — PROPOFOL 500 MG/50ML IV EMUL
INTRAVENOUS | Status: DC | PRN
  Administered 2018-09-05: 120 ug/kg/min via INTRAVENOUS

## 2018-09-05 MED ORDER — LIDOCAINE HCL (CARDIAC) PF 100 MG/5ML IV SOSY
PREFILLED_SYRINGE | INTRAVENOUS | Status: DC | PRN
  Administered 2018-09-05: 30 mg via INTRAVENOUS

## 2018-09-05 MED ORDER — EPHEDRINE SULFATE 50 MG/ML IJ SOLN
INTRAMUSCULAR | Status: DC | PRN
  Administered 2018-09-05: 10 mg via INTRAVENOUS

## 2018-09-05 NOTE — Anesthesia Procedure Notes (Signed)
Performed by: Cook-Martin, Shelli Portilla Pre-anesthesia Checklist: Patient identified, Emergency Drugs available, Suction available, Patient being monitored and Timeout performed Patient Re-evaluated:Patient Re-evaluated prior to induction Oxygen Delivery Method: Nasal cannula Preoxygenation: Pre-oxygenation with 100% oxygen Induction Type: IV induction Airway Equipment and Method: Bite block Placement Confirmation: CO2 detector and positive ETCO2       

## 2018-09-05 NOTE — Transfer of Care (Signed)
Immediate Anesthesia Transfer of Care Note  Patient: Colin Gutierrez  Procedure(s) Performed: ESOPHAGOGASTRODUODENOSCOPY (EGD) (N/A )  Patient Location: PACU  Anesthesia Type:General  Level of Consciousness: awake and sedated  Airway & Oxygen Therapy: Patient Spontanous Breathing and Patient connected to nasal cannula oxygen  Post-op Assessment: Report given to RN and Post -op Vital signs reviewed and stable  Post vital signs: Reviewed and stable  Last Vitals:  Vitals Value Taken Time  BP    Temp    Pulse    Resp    SpO2      Last Pain:  Vitals:   09/05/18 0719  PainSc: 0-No pain         Complications: No apparent anesthesia complications

## 2018-09-05 NOTE — Op Note (Signed)
[facility] Gastroenterology Patient Name: Colin Gutierrez Procedure Date: 09/05/2018 8:32 AM MRN: [id number] Account #: [id number] Date of Birth: 1946-03-20 Admit Type: Outpatient Age: 73 Room: [facility] ENDO ROOM 3 Gender: Male Note Status: Finalized Procedure:            Upper GI endoscopy Indications:          Cirrhosis rule out esophageal varices Providers:            Lollie Sails, MD Referring MD:         Andres Labrum, MD (Referring MD) Medicines:            Monitored Anesthesia Care Complications:        No immediate complications. Procedure:            Pre-Anesthesia Assessment:                       - ASA Grade Assessment: III - A patient with severe                        systemic disease.                       After obtaining informed consent, the endoscope was                        passed under direct vision. Throughout the procedure,                        the patient's blood pressure, pulse, and oxygen                        saturations were monitored continuously. The was                        introduced through the mouth, and advanced to the third                        part of duodenum. The upper GI endoscopy was                        accomplished without difficulty. The patient tolerated                        the procedure well. Findings:      There is no evidence of esophageal or gastric varices.      LA Grade B (one or more mucosal breaks greater than 5 mm, not extending       between the tops of two mucosal folds) esophagitis with no bleeding was       found. Biopsies were taken with a cold forceps for histology.      A small hiatal hernia was found. The Z-line was a variable distance from       incisors; the hiatal hernia was sliding.      Patchy moderate inflammation characterized by adherent blood, congestion       (edema), erosions and erythema was found in the gastric antrum. Biopsies       were taken with a cold forceps for  histology and helicobacter pylori.      Patchy mild inflammation characterized by erosions and erythema was       found  in the gastric body. Biopsies were taken with a cold forceps for       histology. Biopsies were taken with a cold forceps for Helicobacter       pylori testing.      The cardia and gastric fundus were normal on retroflexion otherwise.      The examined duodenum was normal. Impression:           - LA Grade B erosive esophagitis. Biopsied.                       - Small hiatal hernia.                       - Erosive gastritis. Biopsied.                       - Erosive gastritis. Biopsied.                       - Normal examined duodenum. Recommendation:       - Use Protonix (pantoprazole) 40 mg PO BID for 1 month.                       - Use Protonix (pantoprazole) 40 mg PO daily daily. Procedure Code(s):    --- Professional ---                       [phone], Esophagogastroduodenoscopy, flexible, transoral;                        with biopsy, single or multiple Diagnosis Code(s):    --- Professional ---                       K20.8, Other esophagitis                       K44.9, Diaphragmatic hernia without obstruction or                        gangrene                       K29.60, Other gastritis without bleeding                       K74.60, Unspecified cirrhosis of liver CPT copyright 2018 American Medical Association. All rights reserved. The codes documented in this report are preliminary and upon coder review may  be revised to meet current compliance requirements. Lollie Sails, MD 09/05/2018 9:10:57 AM This report has been signed electronically. Number of Addenda: 0 Note Initiated On: 09/05/2018 8:32 AM      [facility]

## 2018-09-05 NOTE — H&P (Signed)
Outpatient short stay form Pre-procedure 09/05/2018 8:32 AM Colin Sails MD  Primary Physician: Dr Harrel Lemon  Reason for visit: EGD  History of present illness: Patient is a 73 year old male presenting today for an EGD in regards to a recent diagnosis of cirrhosis of the liver.  He has no upper GI symptoms.  His liver evaluation indicated a remote hepatitis C and he has a negative PCR test.  However he also had a echocardiogram done showing a severe tricuspid regurgitation I have some concern for possible cardiac cirrhosis.  Does take 81 mg aspirin daily.  He takes no other aspirin products or blood thinner.  His platelet count today was 153 his neutrophil count was 3.3.  His INR was 1.0.    Current Facility-Administered Medications:  .  0.9 %  sodium chloride infusion, , Intravenous, Continuous, Colin Sails, MD  Medications Prior to Admission  Medication Sig Dispense Refill Last Dose  . allopurinol (ZYLOPRIM) 100 MG tablet Take 100 mg by mouth daily.   09/05/2018 at 0600  . aspirin EC 81 MG tablet Take 81 mg by mouth daily.   09/05/2018 at 0600  . ferrous sulfate 325 (65 FE) MG tablet Take 1 tablet (325 mg total) by mouth 2 (two) times daily with a meal. 30 tablet 0 09/05/2018 at 0600  . furosemide (LASIX) 20 MG tablet TAKE ONE TABLET BY MOUTH EVERY DAY   09/05/2018 at 0600  . gemfibrozil (LOPID) 600 MG tablet Take 600 mg by mouth 2 (two) times daily before a meal.   09/05/2018 at 0600  . hydrochlorothiazide (HYDRODIURIL) 25 MG tablet Take 25 mg by mouth daily. Reported on 02/08/2016   09/05/2018 at 0600  . lisinopril (PRINIVIL,ZESTRIL) 2.5 MG tablet    09/05/2018 at 0+00  . metFORMIN (GLUCOPHAGE) 1000 MG tablet Take 1,000 mg by mouth 2 (two) times daily with a meal. Reported on 02/08/2016   09/05/2018 at 0600  . metoprolol succinate (TOPROL-XL) 50 MG 24 hr tablet Take 50 mg by mouth daily. Take with or immediately following a meal.   09/05/2018 at 0600  . sildenafil (REVATIO) 20 MG tablet  Reported on 02/08/2016   09/04/2018 at Unknown time  . silver sulfADIAZINE (SILVADENE) 1 % cream Reported on 02/08/2016  0 09/04/2018 at Unknown time  . cephALEXin (KEFLEX) 500 MG capsule Reported on 02/08/2016  0 Taking  . Glucosamine-Chondroitin 250-200 MG TABS Take by mouth.   Taking  . HYDROcodone-acetaminophen (NORCO/VICODIN) 5-325 MG tablet Take by mouth. Reported on 02/08/2016   Taking  . oxyCODONE (OXY IR/ROXICODONE) 5 MG immediate release tablet Take 1-2 tablets (5-10 mg total) by mouth every 4 (four) hours as needed for breakthrough pain. 80 tablet 0 Taking     No Known Allergies   Past Medical History:  Diagnosis Date  . Arthritis   . Cancer ([place])    leukemia  . CLL (chronic lymphocytic leukemia) ([place]) 02/08/2016  . CLL (chronic lymphocytic leukemia) ([place])   . Coronary artery disease   . Diabetes mellitus without complication ([place])   . Duodenitis   . Dysrhythmia    atrial fib  . Erosive gastritis   . Heart murmur   . HH (hiatus hernia)   . History of hiatal hernia   . History of kidney stones   . Hypertension     Review of systems:      Physical Exam    Heart and lungs: Rate and rhythm without rub or gallop, lungs are bilaterally clear.  HEENT: Normocephalic atraumatic eyes are anicteric    Other:    Pertinant exam for procedure: Soft nontender nondistended bowel sounds positive normoactive.    Planned proceedures: EGD and indicated procedures. I have discussed the risks benefits and complications of procedures to include not limited to bleeding, infection, perforation and the risk of sedation and the patient wishes to proceed.    Colin Sails, MD Gastroenterology 09/05/2018  8:32 AM

## 2018-09-05 NOTE — Anesthesia Preprocedure Evaluation (Signed)
Anesthesia Evaluation  Patient identified by MRN, date of birth, ID band Patient awake    Reviewed: Allergy & Precautions, H&P , NPO status , Patient's Chart, lab work & pertinent test results, reviewed documented beta blocker date and time   Airway Mallampati: II   Neck ROM: full    Dental  (+) Poor Dentition, Teeth Intact   Pulmonary neg pulmonary ROS, former smoker,    Pulmonary exam normal        Cardiovascular Exercise Tolerance: Good hypertension, On Medications + CAD and +CHF  (-) Orthopnea Normal cardiovascular exam+ dysrhythmias + Valvular Problems/Murmurs  Rhythm:regular Rate:Normal     Neuro/Psych negative neurological ROS  negative psych ROS   GI/Hepatic Neg liver ROS, hiatal hernia, PUD,   Endo/Other  negative endocrine ROSdiabetes, Well Controlled, Type 2  Renal/GU negative Renal ROS  negative genitourinary   Musculoskeletal   Abdominal   Peds  Hematology negative hematology ROS (+)   Anesthesia Other Findings Past Medical History: No date: Arthritis No date: Cancer [facility])     Comment:  leukemia 02/08/2016: CLL (chronic lymphocytic leukemia) ([facility]) No date: CLL (chronic lymphocytic leukemia) ([facility]) No date: Coronary artery disease No date: Diabetes mellitus without complication ([facility]) No date: Duodenitis No date: Dysrhythmia     Comment:  atrial fib No date: Erosive gastritis No date: Heart murmur No date: HH (hiatus hernia) No date: History of hiatal hernia No date: History of kidney stones No date: Hypertension Past Surgical History: No date: ASD REPAIR     Comment:  age 26 No date: CARDIAC ELECTROPHYSIOLOGY STUDY AND ABLATION     Comment:  1995 No date: CORONARY ARTERY BYPASS GRAFT     Comment:  1963 No date: HERNIA REPAIR No date: JOINT REPLACEMENT 12/02/2014: TOTAL KNEE ARTHROPLASTY; Left     Comment:  Procedure: TOTAL KNEE ARTHROPLASTY- computer assisted;                Surgeon: Dereck Leep, MD;  Location: [facility] ORS;                Service: Orthopedics;  Laterality: Left; BMI    Body Mass Index:  28.37 kg/m     Reproductive/Obstetrics negative OB ROS                             Anesthesia Physical Anesthesia Plan  ASA: III  Anesthesia Plan: General   Post-op Pain Management:    Induction:   PONV Risk Score and Plan:   Airway Management Planned:   Additional Equipment:   Intra-op Plan:   Post-operative Plan:   Informed Consent: I have reviewed the patients History and Physical, chart, labs and discussed the procedure including the risks, benefits and alternatives for the proposed anesthesia with the patient or authorized representative who has indicated his/her understanding and acceptance.     Dental Advisory Given  Plan Discussed with: CRNA  Anesthesia Plan Comments:         Anesthesia Quick Evaluation

## 2018-09-05 NOTE — Anesthesia Post-op Follow-up Note (Signed)
Anesthesia QCDR form completed.        

## 2018-09-06 ENCOUNTER — Encounter: Payer: Self-pay | Admitting: Gastroenterology

## 2018-09-06 LAB — SURGICAL PATHOLOGY

## 2018-09-06 NOTE — Anesthesia Postprocedure Evaluation (Signed)
Anesthesia Post Note  Patient: Colin Gutierrez  Procedure(s) Performed: ESOPHAGOGASTRODUODENOSCOPY (EGD) (N/A )  Patient location during evaluation: PACU Anesthesia Type: General Level of consciousness: awake and alert Pain management: pain level controlled Vital Signs Assessment: post-procedure vital signs reviewed and stable Respiratory status: spontaneous breathing, nonlabored ventilation, respiratory function stable and patient connected to nasal cannula oxygen Cardiovascular status: blood pressure returned to baseline and stable Postop Assessment: no apparent nausea or vomiting Anesthetic complications: no     Last Vitals:  Vitals:   09/05/18 0928 09/05/18 0938  BP: 107/66 115/65  Pulse: (!) 59 (!) 48  Resp: 18 17  Temp:    SpO2: 95% 95%    Last Pain:  Vitals:   09/06/18 0747  TempSrc:   PainSc: 0-No pain                 Molli Barrows

## 2018-12-12 ENCOUNTER — Other Ambulatory Visit: Payer: Self-pay | Admitting: Gastroenterology

## 2018-12-12 DIAGNOSIS — K7460 Unspecified cirrhosis of liver: Secondary | ICD-10-CM

## 2018-12-17 ENCOUNTER — Ambulatory Visit: Admission: RE | Admit: 2018-12-17 | Payer: Medicare Other | Source: Ambulatory Visit

## 2019-07-04 ENCOUNTER — Other Ambulatory Visit: Payer: Self-pay

## 2019-07-04 DIAGNOSIS — Z20822 Contact with and (suspected) exposure to covid-19: Secondary | ICD-10-CM

## 2019-07-06 LAB — NOVEL CORONAVIRUS, NAA: SARS-CoV-2, NAA: NOT DETECTED

## 2019-07-06 NOTE — Progress Notes (Signed)
[place]  [phone] Fax:(336) [phone]  ID: KEONTAY VORA OB: 75-30-1947  MR#: [id number]  HYI#:[id number]  Patient Care Team: Baxter Hire, MD as PCP - General (Internal Medicine)  I connected with Diona Foley on 07/11/19 at 11:00 AM EST by video enabled telemedicine visit and verified that I am speaking with the correct person using two identifiers.   I discussed the limitations, risks, security and privacy concerns of performing an evaluation and management service by telemedicine and the availability of in-person appointments. I also discussed with the patient that there may be a patient responsible charge related to this service. The patient expressed understanding and agreed to proceed.   Other persons participating in the visit and their role in the encounter: Patient, MD.  Patient's location: Home. Provider's location: Clinic.  CHIEF COMPLAINT: CLL  INTERVAL HISTORY: Patient agreed to video assisted telemedicine visit for his routine yearly evaluation.  He continues to feel well and remains asymptomatic. He denies any fevers, night sweats, or weight loss. He has no neurologic complaints.  He denies any chest pain, shortness of breath, cough, or hemoptysis.  He has no nausea, vomiting, constipation, or diarrhea. He has no urinary complaints.  Patient feels at his baseline offers no specific complaints today.  REVIEW OF SYSTEMS:   Review of Systems  Constitutional: Negative.  Negative for fever, malaise/fatigue and weight loss.  Respiratory: Negative.  Negative for cough and shortness of breath.   Cardiovascular: Negative.  Negative for chest pain and leg swelling.  Gastrointestinal: Negative.  Negative for abdominal pain and melena.  Genitourinary: Negative.  Negative for dysuria.  Musculoskeletal: Negative.  Negative for back pain.  Skin: Negative.  Negative for rash.  Neurological: Negative.  Negative for focal weakness, weakness and  headaches.  Psychiatric/Behavioral: Negative.  The patient is not nervous/anxious.     As per HPI. Otherwise, a complete review of systems is negative.  PAST MEDICAL HISTORY: Past Medical History:  Diagnosis Date  . Arthritis   . Cancer ([place])    leukemia  . 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 CLL (chronic lymphocytic leukemia) ([place])   . Coronary artery disease   . Diabetes mellitus without complication ([place])   . Duodenitis   . Dysrhythmia    atrial fib  . Erosive gastritis   . Heart murmur   . HH (hiatus hernia)   . History of hiatal hernia   . History of kidney stones   . Hypertension     PAST SURGICAL HISTORY: Past Surgical History:  Procedure Laterality Date  . ASD REPAIR     age 54  . CARDIAC ELECTROPHYSIOLOGY STUDY AND ABLATION     1995  . CORONARY ARTERY BYPASS GRAFT     1963  . ESOPHAGOGASTRODUODENOSCOPY N/A 09/05/2018   Procedure: ESOPHAGOGASTRODUODENOSCOPY (EGD);  Surgeon: Lollie Sails, MD;  Location: [facility] ENDOSCOPY;  Service: Endoscopy;  Laterality: N/A;  . HERNIA REPAIR    . JOINT REPLACEMENT    . TOTAL KNEE ARTHROPLASTY Left 12/02/2014   Procedure: TOTAL KNEE ARTHROPLASTY- computer assisted;  Surgeon: Dereck Leep, MD;  Location: [facility] ORS;  Service: Orthopedics;  Laterality: Left;    FAMILY HISTORY: Reviewed and unchanged. Daughter with stage IV colon cancer otherwise negative and noncontributory.  ADVANCED DIRECTIVES (Y/N):  N  HEALTH MAINTENANCE: Social History   Tobacco Use  . Smoking status: Former Research scientist (life sciences)  . Smokeless tobacco: Former Systems developer    Types: Chew    Quit date: 11/17/1968  Substance Use Topics  . Alcohol use: Yes    Alcohol/week: 2.0 standard drinks    Types: 2 Glasses of wine per week  . Drug use: Not on file     Colonoscopy:  PAP:  Bone density:  Lipid panel:  No Known Allergies  Current Outpatient Medications  Medication Sig Dispense Refill  . allopurinol (ZYLOPRIM) 100 MG tablet Take 100 mg by mouth  daily.    Marland Kitchen aspirin EC 81 MG tablet Take 81 mg by mouth daily.    . cephALEXin (KEFLEX) 500 MG capsule Reported on 02/08/2016  0  . ferrous sulfate 325 (65 FE) MG tablet Take 1 tablet (325 mg total) by mouth 2 (two) times daily with a meal. 30 tablet 0  . furosemide (LASIX) 20 MG tablet TAKE ONE TABLET BY MOUTH EVERY DAY    . gemfibrozil (LOPID) 600 MG tablet Take 600 mg by mouth 2 (two) times daily before a meal.    . Glucosamine-Chondroitin 250-200 MG TABS Take by mouth.    . hydrochlorothiazide (HYDRODIURIL) 25 MG tablet Take 25 mg by mouth daily. Reported on 02/08/2016    . HYDROcodone-acetaminophen (NORCO/VICODIN) 5-325 MG tablet Take by mouth. Reported on 02/08/2016    . lisinopril (PRINIVIL,ZESTRIL) 2.5 MG tablet     . metFORMIN (GLUCOPHAGE) 1000 MG tablet Take 1,000 mg by mouth 2 (two) times daily with a meal. Reported on 02/08/2016    . metoprolol succinate (TOPROL-XL) 50 MG 24 hr tablet Take 50 mg by mouth daily. Take with or immediately following a meal.    . oxyCODONE (OXY IR/ROXICODONE) 5 MG immediate release tablet Take 1-2 tablets (5-10 mg total) by mouth every 4 (four) hours as needed for breakthrough pain. 80 tablet 0  . sildenafil (REVATIO) 20 MG tablet Reported on 02/08/2016    . silver sulfADIAZINE (SILVADENE) 1 % cream Reported on 02/08/2016  0   No current facility-administered medications for this visit.    OBJECTIVE: There were no vitals filed for this visit.   There is no height or weight on file to calculate BMI.    ECOG FS:0 - Asymptomatic  General: Well-developed, well-nourished, no acute distress. HEENT: Normocephalic. Neuro: Alert, answering all questions appropriately. Cranial nerves grossly intact. Psych: Normal affect.  LAB RESULTS:  Lab Results  Component Value Date   NA 137 02/06/2017   K 3.8 02/06/2017   CL 101 02/06/2017   CO2 24 02/06/2017   GLUCOSE 146 (H) 02/06/2017   BUN 29 (H) 02/06/2017   CREATININE 1.29 (H) 05/16/2018   CALCIUM 9.6  02/06/2017   PROT 8.0 02/06/2017   ALBUMIN 4.5 02/06/2017   AST 27 02/06/2017   ALT 13 (L) 02/06/2017   ALKPHOS 48 02/06/2017   BILITOT 0.7 02/06/2017   GFRNONAA 53 (L) 05/16/2018   GFRAA >60 05/16/2018    Lab Results  Component Value Date   WBC 8.6 09/05/2018   NEUTROABS 3.3 09/05/2018   HGB 13.3 09/05/2018   HCT 40.6 09/05/2018   MCV 92.7 09/05/2018   PLT 153 09/05/2018     STUDIES: No results found.  ASSESSMENT: CLL, Rai stage 0  PLAN:    1. CLL: Patient's white blood cell count continues to be within normal limits.  He continues to have no other cytopenias or lymphadenopathy.  Repeat peripheral blood flow cytometry from today is pending at time of dictation.  CLL FISH panel revealed 13 q. deletion.  Patients with this deletion is a sole anomaly detected by [facility] are reported to  have the longest survival.  He has no other cytopenias or lymphadenopathy.  No intervention is needed at this time.  Patient does not require bone marrow biopsy.  Continue simple observation.  Return to clinic in 1 year for repeat laboratory work and further evaluation.    I provided 15 minutes of face-to-face video visit time during this encounter, and > 50% was spent counseling as documented under my assessment & plan.   Patient expressed understanding and was in agreement with this plan. He also understands that He can call clinic at any time with any questions, concerns, or complaints.    Lloyd Huger, MD   07/11/2019 6:25 AM

## 2019-07-07 ENCOUNTER — Telehealth: Payer: Self-pay | Admitting: General Practice

## 2019-07-07 NOTE — Telephone Encounter (Signed)
Pt called In for covid results.   Advised of Not Detected result.

## 2019-07-09 ENCOUNTER — Other Ambulatory Visit: Payer: Self-pay

## 2019-07-09 ENCOUNTER — Inpatient Hospital Stay: Payer: Medicare Other | Attending: Oncology

## 2019-07-09 DIAGNOSIS — Z87891 Personal history of nicotine dependence: Secondary | ICD-10-CM | POA: Insufficient documentation

## 2019-07-09 DIAGNOSIS — Z79899 Other long term (current) drug therapy: Secondary | ICD-10-CM | POA: Diagnosis not present

## 2019-07-09 DIAGNOSIS — C9110 Chronic lymphocytic leukemia of B-cell type not having achieved remission: Secondary | ICD-10-CM | POA: Diagnosis present

## 2019-07-09 NOTE — Progress Notes (Signed)
Patient pre screened for office appointment, no questions or concerns today. Patient reminded of upcoming appointment time and date. 

## 2019-07-10 ENCOUNTER — Inpatient Hospital Stay (HOSPITAL_BASED_OUTPATIENT_CLINIC_OR_DEPARTMENT_OTHER): Payer: Medicare Other | Admitting: Oncology

## 2019-07-10 ENCOUNTER — Inpatient Hospital Stay: Payer: Medicare Other

## 2019-07-10 DIAGNOSIS — C9110 Chronic lymphocytic leukemia of B-cell type not having achieved remission: Secondary | ICD-10-CM

## 2019-07-15 LAB — COMP PANEL: LEUKEMIA/LYMPHOMA

## 2019-07-21 LAB — MISC LABCORP TEST (SEND OUT): Labcorp test code: 510340

## 2020-02-02 IMAGING — CT CT CHEST W/ CM
1 series · 15 of 34 positions shown, 19 images · IV contrast (omnipaque)
Comparison: 11/21/2010

CLINICAL DATA: Shortness of breath, recent weight gain which
improved with diuretics.

EXAM:
CT CHEST WITH CONTRAST
TECHNIQUE: Multidetector CT imaging of the chest was performed during
intravenous contrast administration.
CONTRAST:  75mL OMNIPAQUE IOHEXOL 300 MG/ML  SOLN

[Series 2: axial st · axial · 0.82mm/px · z∈[-636,-360]mm · 15 of 162 slices shown, 19 images]
[im 12/162  mediastinal]
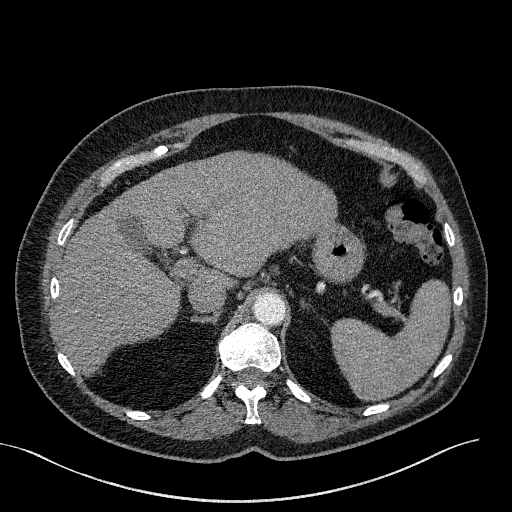
[im 12/162  lung]
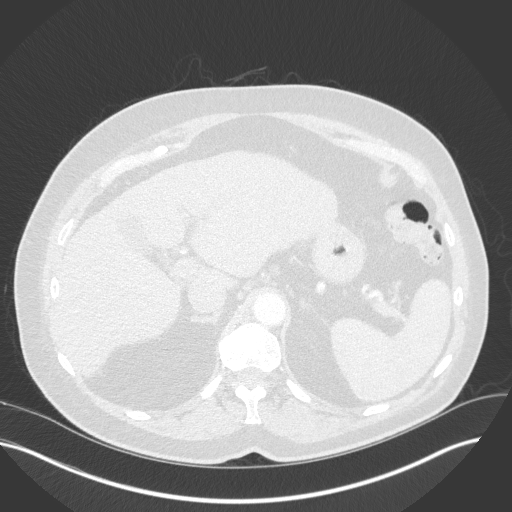
[im 24/162  lung]
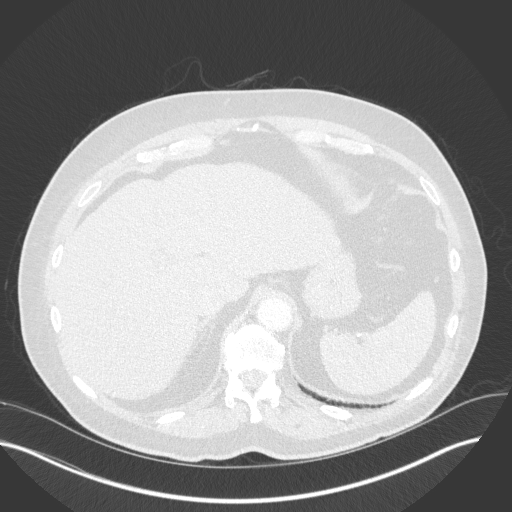
[im 33/162  lung]
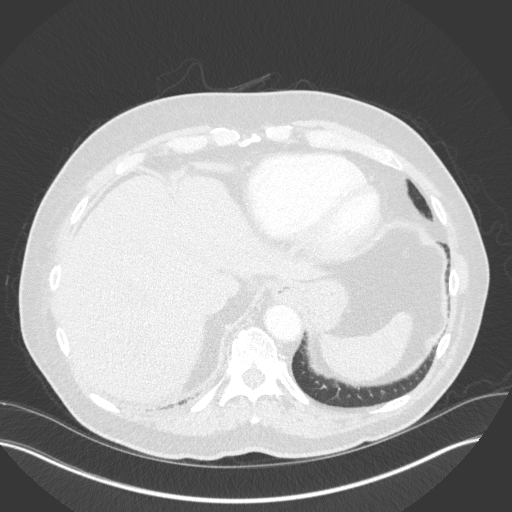
[im 42/162  lung]
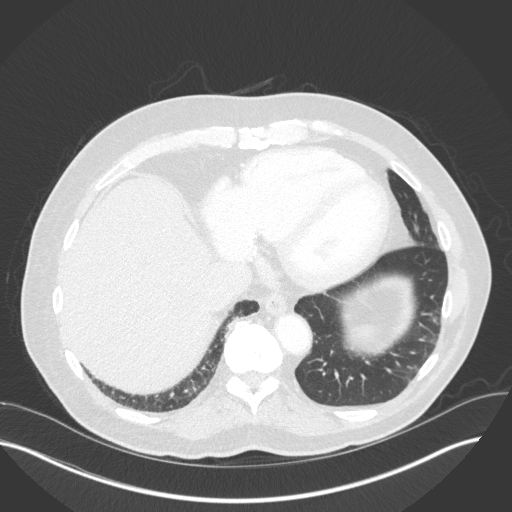
[im 54/162  mediastinal]
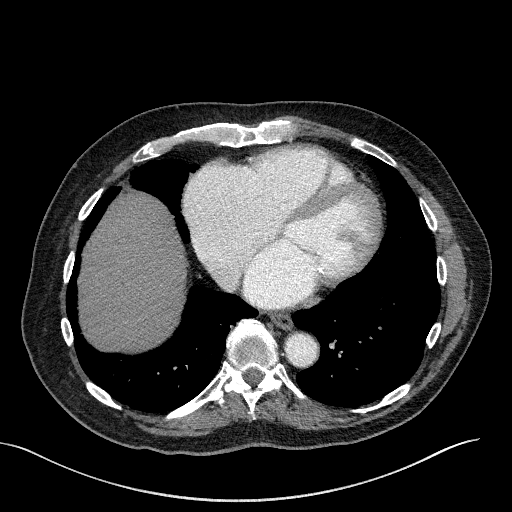
[im 54/162  lung]
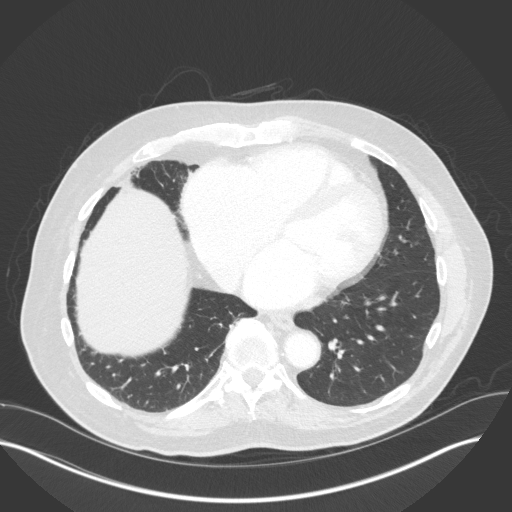
[im 65/162  lung]
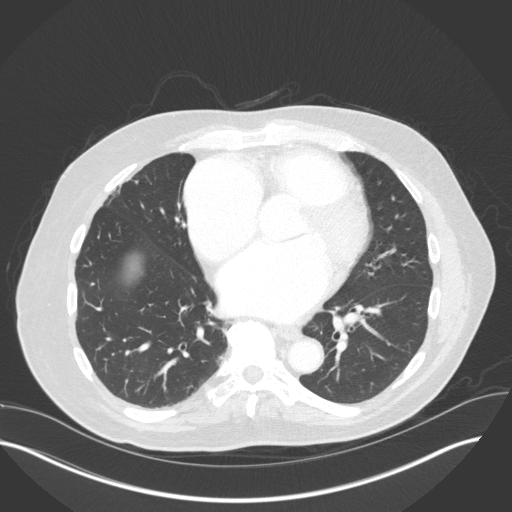
[im 72/162  lung]
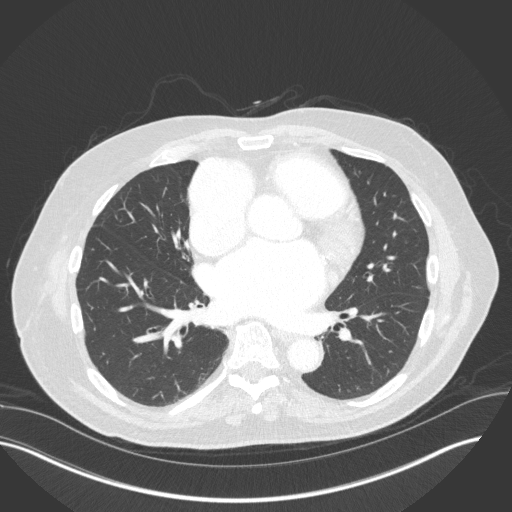
[im 84/162  lung]
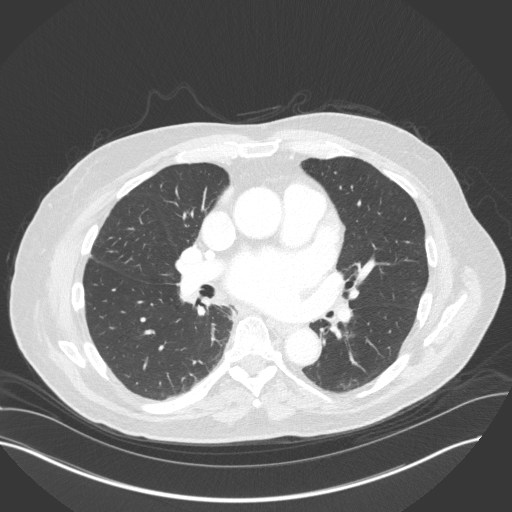
[im 90/162  mediastinal]
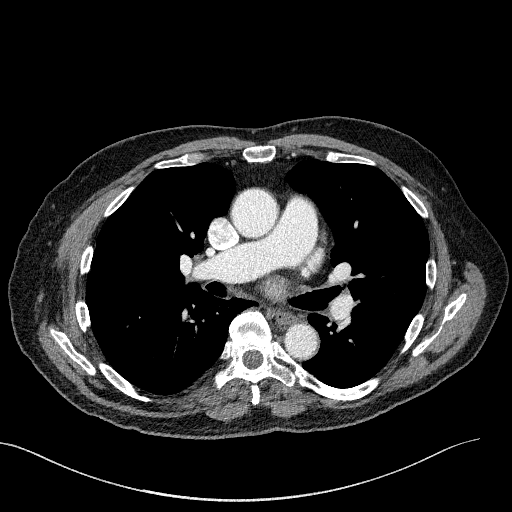
[im 90/162  lung]
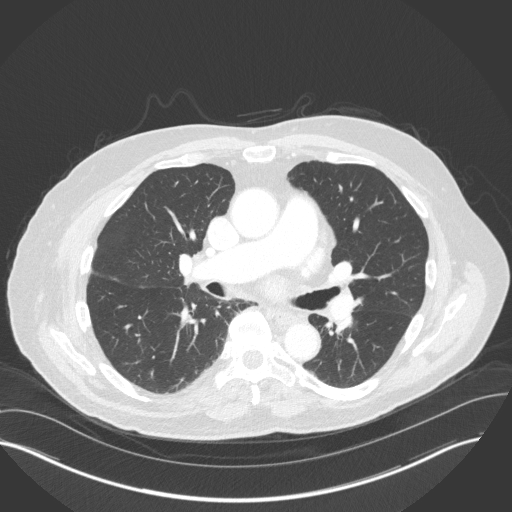
[im 97/162  lung]
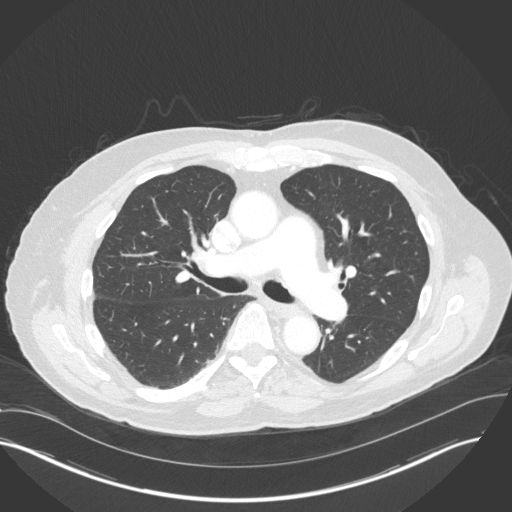
[im 108/162  lung]
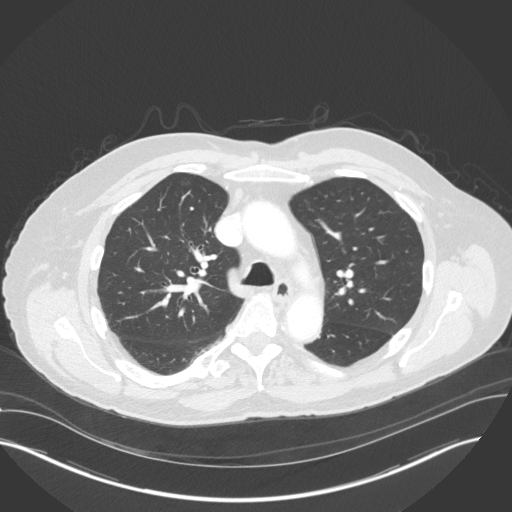
[im 120/162  lung]
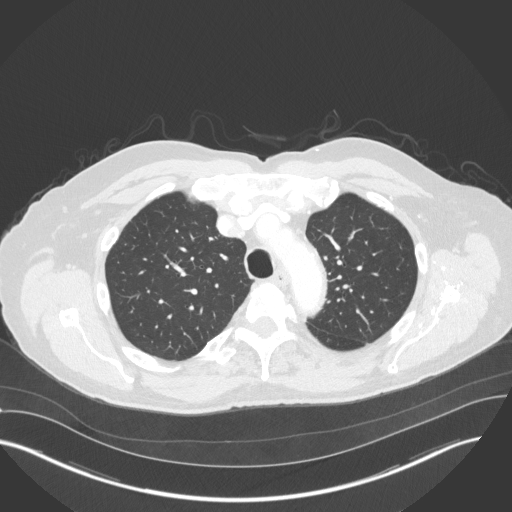
[im 129/162  mediastinal]
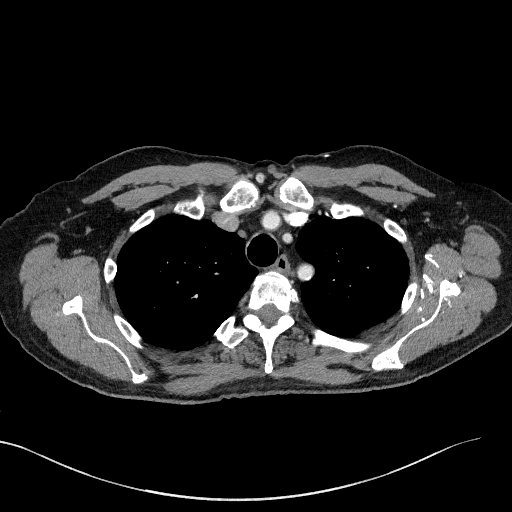
[im 129/162  lung]
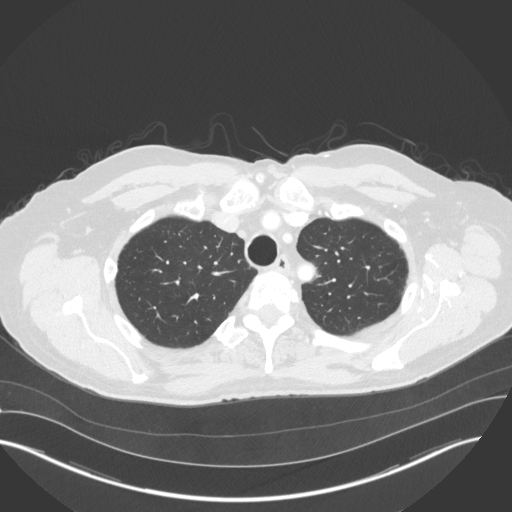
[im 138/162  lung]
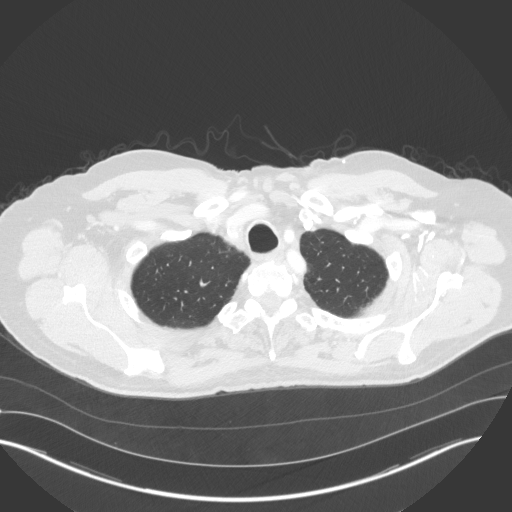
[im 150/162  lung]
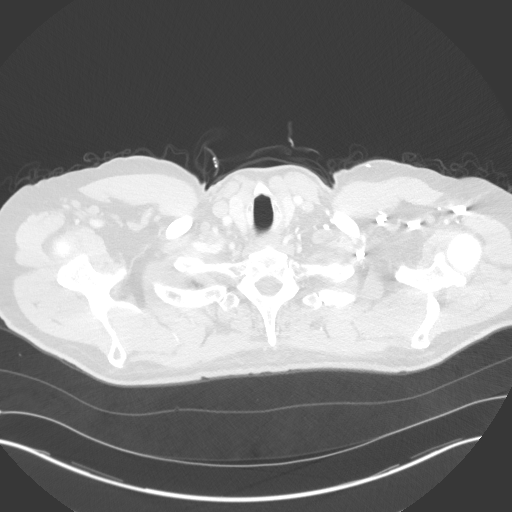

[15 of 34 positions shown; findings below may reference images not displayed]

FINDINGS: Cardiovascular: Heart is enlarged and increased from the prior exam.
Prominence of both the right atrium and left atrium are seen. Some
pericardial calcifications are noted along the course of the right
atrium. Coronary calcifications are noted. The thoracic aorta shows
mild atherosclerotic change without aneurysmal dilatation or
dissection. Although not timed for embolus evaluation, no large
central pulmonary embolus is noted.

Mediastinum/Nodes: Thoracic inlet is within normal limits. No
significant hilar or mediastinal adenopathy is noted. The esophagus
is well visualized and within normal limits.

Lungs/Pleura: The lungs are well aerated bilaterally. No focal
infiltrate or sizable effusion is noted. Minimal left lower lobe
atelectatic changes are seen.

Upper Abdomen: Visualized upper abdomen demonstrates some nodularity
to the liver new from the prior exam which may represent some
underlying hepatocellular disease. No other focal abnormality in the
upper abdomen is seen.

Musculoskeletal: Degenerative changes of the thoracic spine are
noted. No acute bony abnormality is seen.
IMPRESSION: Cardiomegaly increased from the prior exam from 2482. Left and right
atrial enlargement is noted.

No pulmonary embolism is identified.

Changes suggestive of cirrhosis of the liver.

Minimal left basilar atelectasis.

## 2020-03-09 IMAGING — US US ABDOMEN COMPLETE
1 series · 13 of 25 positions shown · non-contrast
Comparison: Abdominal and chest CT scan of May 16, 2018

CLINICAL DATA: Hepatic cirrhosis

EXAM:
ABDOMEN ULTRASOUND COMPLETE

[Series 1: us abdomen complete · 0.25mm/px · 13 of 95 slices shown]
[im 1/95]
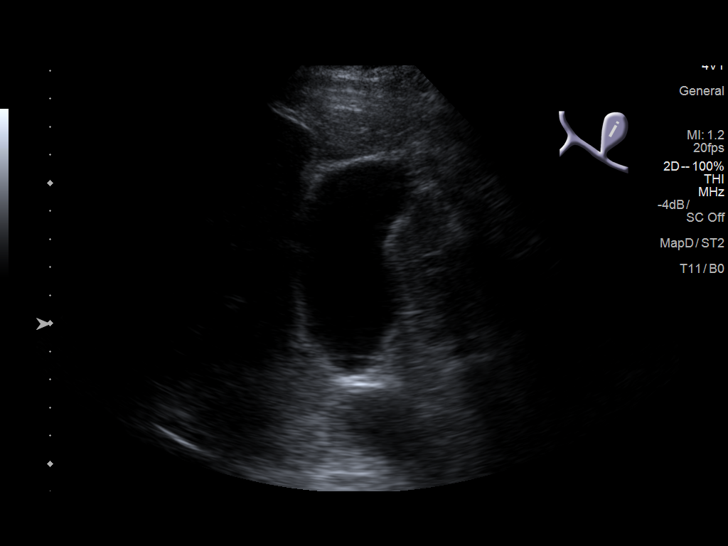
[im 8/95]
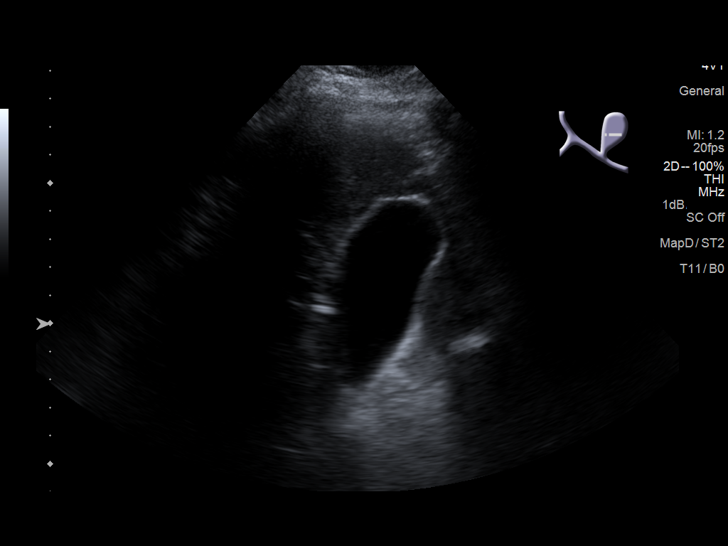
[im 16/95]
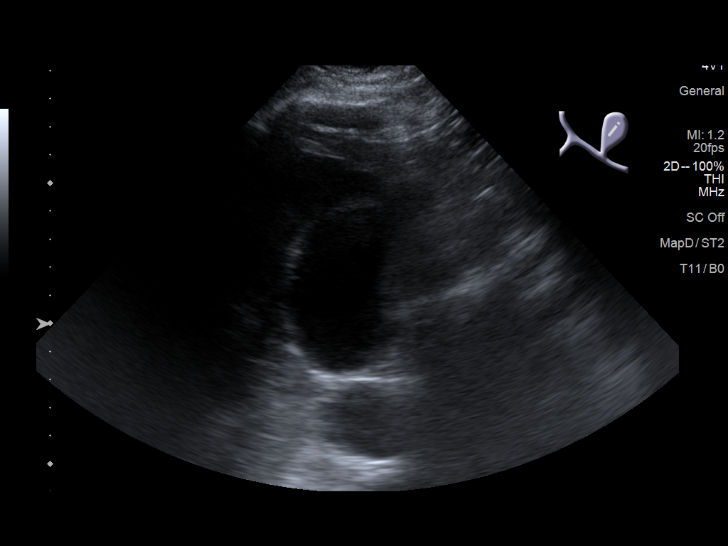
[im 24/95]
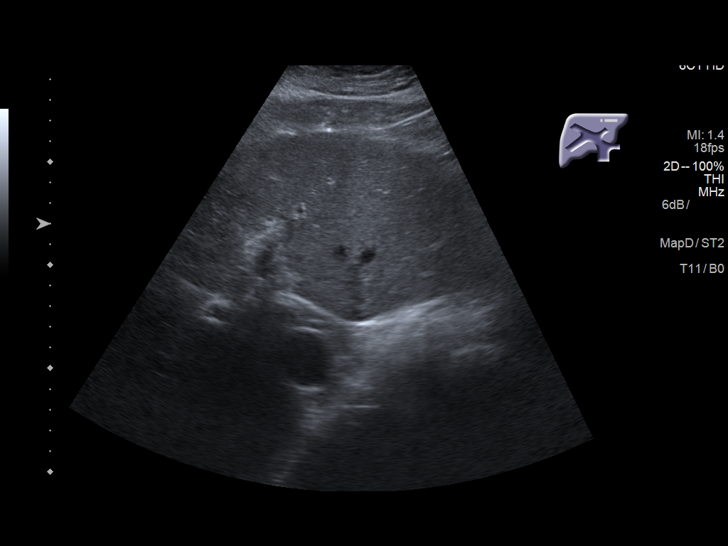
[im 32/95]
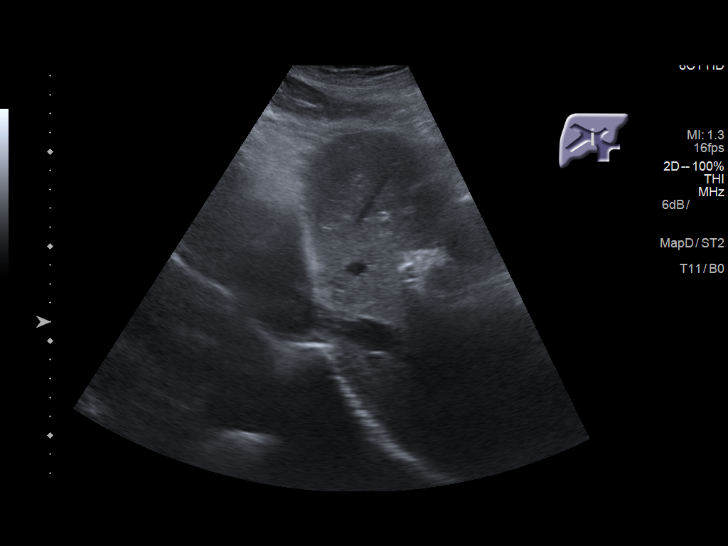
[im 40/95]
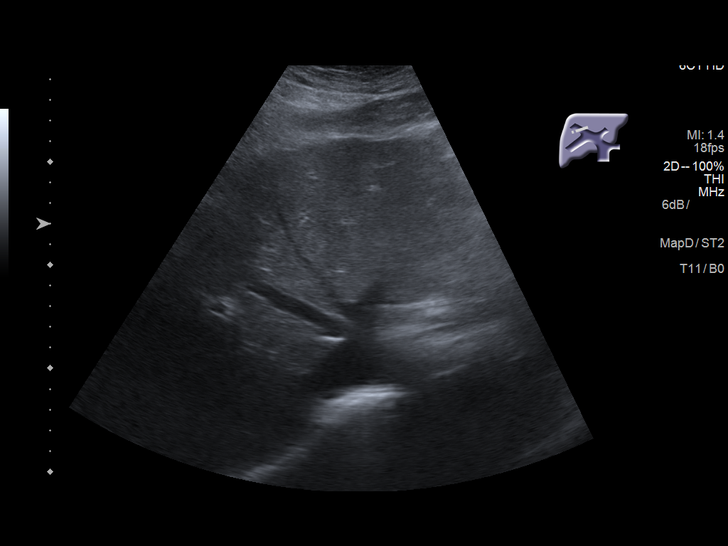
[im 48/95]
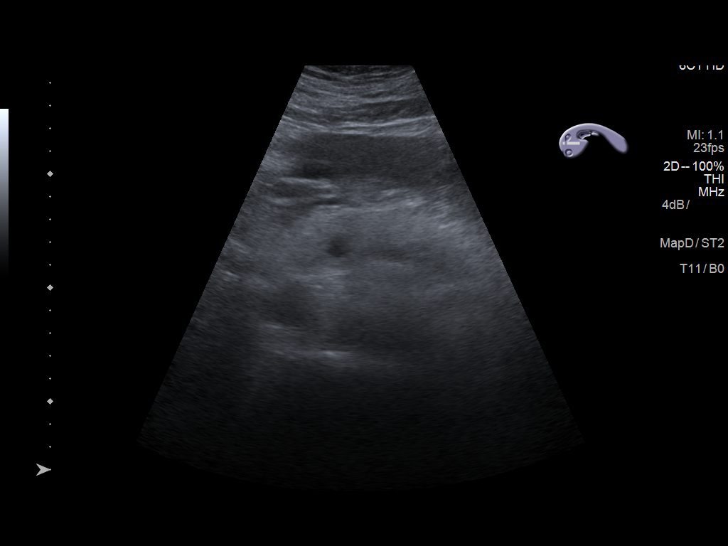
[im 55/95]
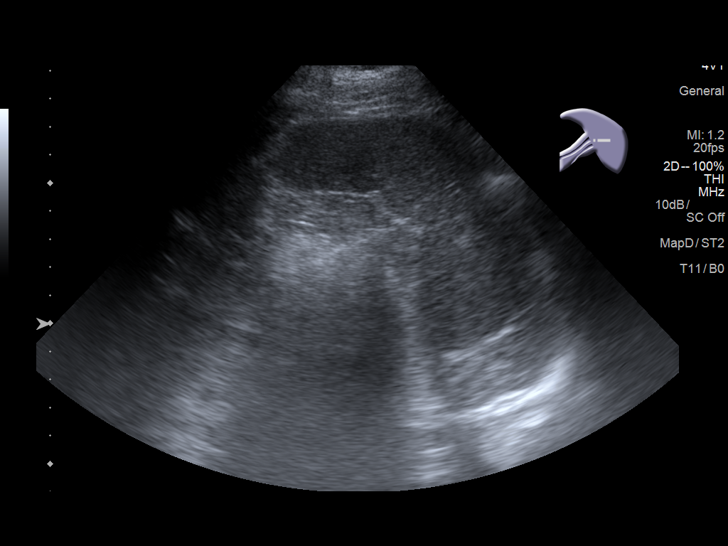
[im 63/95]
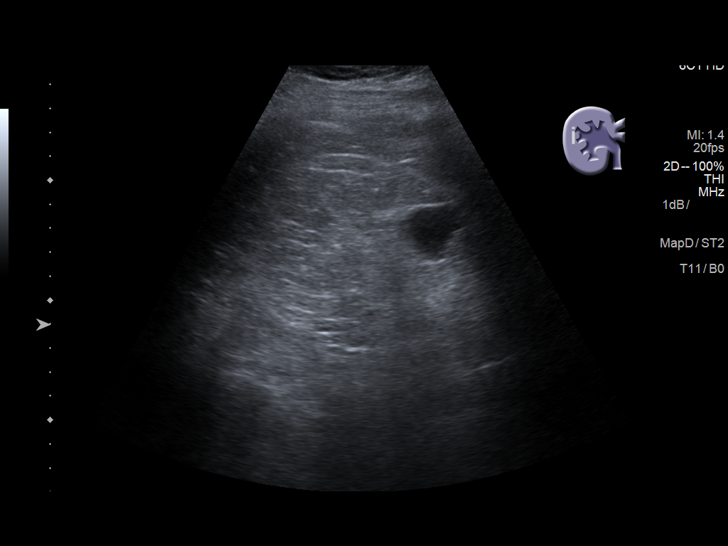
[im 71/95]
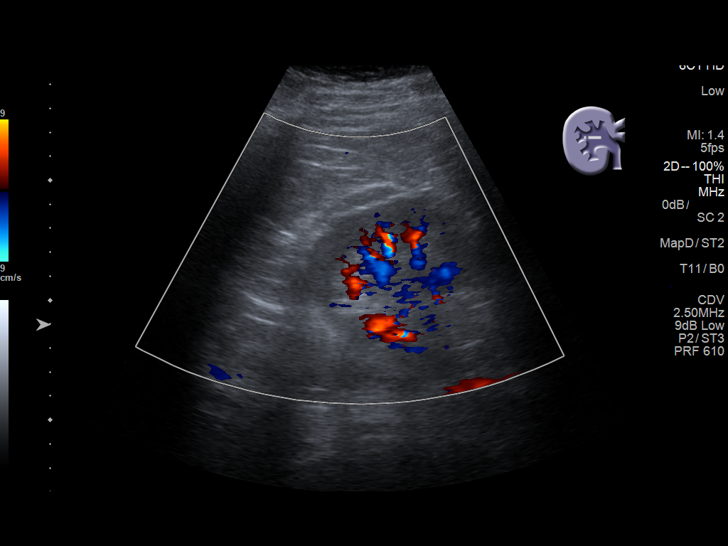
[im 79/95]
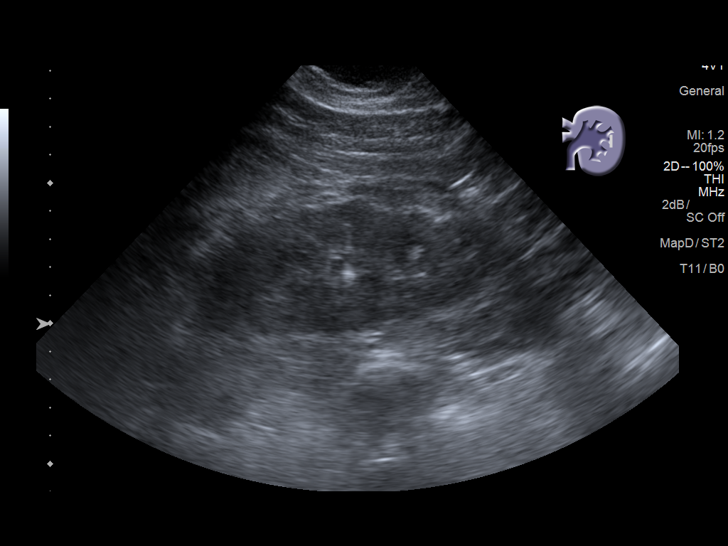
[im 87/95]
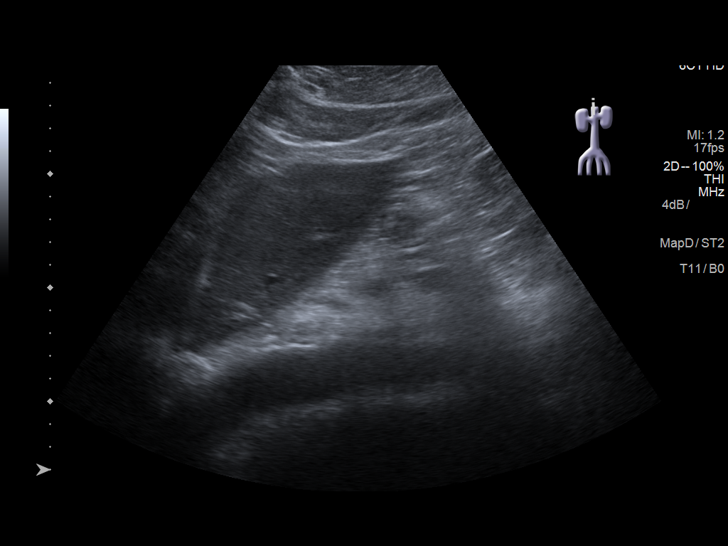
[im 95/95]
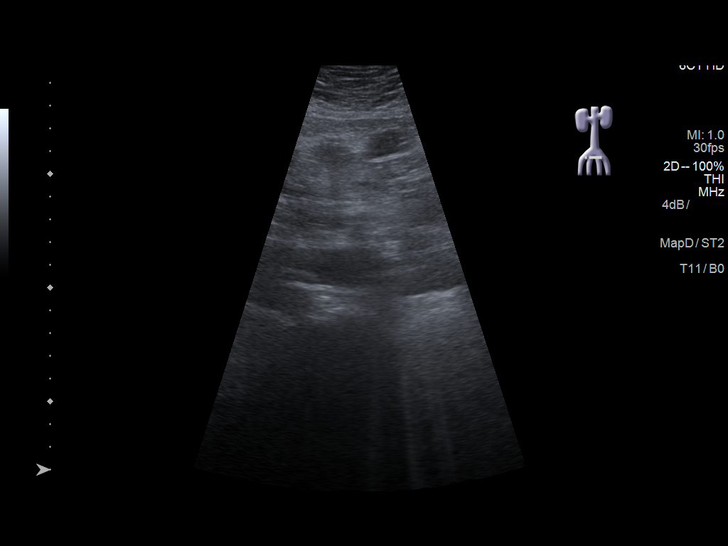

[13 of 25 positions shown; findings below may reference images not displayed]

FINDINGS: Gallbladder: No gallstones or wall thickening visualized. No
sonographic Murphy sign noted by sonographer.

Common bile duct: Diameter: 2.9 mm

Liver: The hepatic echotexture is mildly increased diffusely. The
surface contour is mildly irregular. There is no discrete mass or
ductal dilation. Portal vein is patent on color Doppler imaging with
normal direction of blood flow towards the liver.

IVC: No abnormality visualized.

Pancreas: Visualized portion unremarkable.

Spleen: Size and appearance within normal limits.

Right Kidney: Length: 10.8 cm. The renal cortical echotexture
remains lower than that of the liver. There is a lower pole simple
appearing cyst measuring 2.4 x 2.0 x 1.6 cm. There is no
hydronephrosis.

Left Kidney: Length: 12 cm. Echogenicity within normal limits. No
mass or hydronephrosis visualized.

Abdominal aorta: No aneurysm visualized.

Other findings: None.
IMPRESSION: Increased hepatic echotexture consistent fatty infiltrative change
or early cirrhotic change. The surface contour of the liver is also
mildly irregular. No ascites or splenomegaly.

Normal appearance of the gallbladder, common bile duct, pancreas,
and kidneys.

## 2020-07-03 NOTE — Progress Notes (Unsigned)
[place]  Telephone:(336) [phone] Fax:(336) [phone]  ID: [id number] TRAMMEL OB: August 12, 1945  MR#: [id number]  HWE#:[id number]  Patient Care Team: Baxter Hire, MD as PCP - General (Internal Medicine)  I connected with Diona Foley on 07/03/20 at 10:30 AM EST by video enabled telemedicine visit and verified that I am speaking with the correct person using two identifiers.   I discussed the limitations, risks, security and privacy concerns of performing an evaluation and management service by telemedicine and the availability of in-person appointments. I also discussed with the patient that there may be a patient responsible charge related to this service. The patient expressed understanding and agreed to proceed.   Other persons participating in the visit and their role in the encounter: Patient, MD.  Patient's location: Home. Provider's location: Clinic.  CHIEF COMPLAINT: CLL  INTERVAL HISTORY: Patient agreed to video assisted telemedicine visit for his routine yearly evaluation.  He continues to feel well and remains asymptomatic. He denies any fevers, night sweats, or weight loss. He has no neurologic complaints.  He denies any chest pain, shortness of breath, cough, or hemoptysis.  He has no nausea, vomiting, constipation, or diarrhea. He has no urinary complaints.  Patient feels at his baseline offers no specific complaints today.  REVIEW OF SYSTEMS:   Review of Systems  Constitutional: Negative.  Negative for fever, malaise/fatigue and weight loss.  Respiratory: Negative.  Negative for cough and shortness of breath.   Cardiovascular: Negative.  Negative for chest pain and leg swelling.  Gastrointestinal: Negative.  Negative for abdominal pain and melena.  Genitourinary: Negative.  Negative for dysuria.  Musculoskeletal: Negative.  Negative for back pain.  Skin: Negative.  Negative for rash.  Neurological: Negative.  Negative for focal weakness, weakness and  headaches.  Psychiatric/Behavioral: Negative.  The patient is not nervous/anxious.     As per HPI. Otherwise, a complete review of systems is negative.  PAST MEDICAL HISTORY: Past Medical History:  Diagnosis Date  . Arthritis   . Cancer ([place])    leukemia  . 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 CLL (chronic lymphocytic leukemia) ([place])   . Coronary artery disease   . Diabetes mellitus without complication ([place])   . Duodenitis   . Dysrhythmia    atrial fib  . Erosive gastritis   . Heart murmur   . HH (hiatus hernia)   . History of hiatal hernia   . History of kidney stones   . Hypertension     PAST SURGICAL HISTORY: Past Surgical History:  Procedure Laterality Date  . ASD REPAIR     age 18  . CARDIAC ELECTROPHYSIOLOGY STUDY AND ABLATION     1995  . CORONARY ARTERY BYPASS GRAFT     1963  . ESOPHAGOGASTRODUODENOSCOPY N/A 09/05/2018   Procedure: ESOPHAGOGASTRODUODENOSCOPY (EGD);  Surgeon: Lollie Sails, MD;  Location: [facility] ENDOSCOPY;  Service: Endoscopy;  Laterality: N/A;  . HERNIA REPAIR    . JOINT REPLACEMENT    . TOTAL KNEE ARTHROPLASTY Left 12/02/2014   Procedure: TOTAL KNEE ARTHROPLASTY- computer assisted;  Surgeon: Dereck Leep, MD;  Location: [facility] ORS;  Service: Orthopedics;  Laterality: Left;    FAMILY HISTORY: Reviewed and unchanged. Daughter with stage IV colon cancer otherwise negative and noncontributory.  ADVANCED DIRECTIVES (Y/N):  N  HEALTH MAINTENANCE: Social History   Tobacco Use  . Smoking status: Former Research scientist (life sciences)  . Smokeless tobacco: Former Systems developer    Types: Chew    Quit date: 11/17/1968  Vaping Use  . Vaping Use: Never used  Substance Use Topics  . Alcohol use: Yes    Alcohol/week: 2.0 standard drinks    Types: 2 Glasses of wine per week  . Drug use: Not on file     Colonoscopy:  PAP:  Bone density:  Lipid panel:  No Known Allergies  Current Outpatient Medications  Medication Sig Dispense Refill  . allopurinol  (ZYLOPRIM) 100 MG tablet Take 100 mg by mouth daily.    Marland Kitchen aspirin EC 81 MG tablet Take 81 mg by mouth daily.    . cephALEXin (KEFLEX) 500 MG capsule Reported on 02/08/2016  0  . ferrous sulfate 325 (65 FE) MG tablet Take 1 tablet (325 mg total) by mouth 2 (two) times daily with a meal. 30 tablet 0  . furosemide (LASIX) 20 MG tablet TAKE ONE TABLET BY MOUTH EVERY DAY    . gemfibrozil (LOPID) 600 MG tablet Take 600 mg by mouth 2 (two) times daily before a meal.    . Glucosamine-Chondroitin 250-200 MG TABS Take by mouth.    . hydrochlorothiazide (HYDRODIURIL) 25 MG tablet Take 25 mg by mouth daily. Reported on 02/08/2016    . HYDROcodone-acetaminophen (NORCO/VICODIN) 5-325 MG tablet Take by mouth. Reported on 02/08/2016    . lisinopril (PRINIVIL,ZESTRIL) 2.5 MG tablet     . metFORMIN (GLUCOPHAGE) 1000 MG tablet Take 1,000 mg by mouth 2 (two) times daily with a meal. Reported on 02/08/2016    . metoprolol succinate (TOPROL-XL) 50 MG 24 hr tablet Take 50 mg by mouth daily. Take with or immediately following a meal.    . oxyCODONE (OXY IR/ROXICODONE) 5 MG immediate release tablet Take 1-2 tablets (5-10 mg total) by mouth every 4 (four) hours as needed for breakthrough pain. 80 tablet 0  . sildenafil (REVATIO) 20 MG tablet Reported on 02/08/2016    . silver sulfADIAZINE (SILVADENE) 1 % cream Reported on 02/08/2016  0   No current facility-administered medications for this visit.    OBJECTIVE: There were no vitals filed for this visit.   There is no height or weight on file to calculate BMI.    ECOG FS:0 - Asymptomatic  General: Well-developed, well-nourished, no acute distress. HEENT: Normocephalic. Neuro: Alert, answering all questions appropriately. Cranial nerves grossly intact. Psych: Normal affect.  LAB RESULTS:  Lab Results  Component Value Date   NA 137 02/06/2017   K 3.8 02/06/2017   CL 101 02/06/2017   CO2 24 02/06/2017   GLUCOSE 146 (H) 02/06/2017   BUN 29 (H) 02/06/2017    CREATININE 1.29 (H) 05/16/2018   CALCIUM 9.6 02/06/2017   PROT 8.0 02/06/2017   ALBUMIN 4.5 02/06/2017   AST 27 02/06/2017   ALT 13 (L) 02/06/2017   ALKPHOS 48 02/06/2017   BILITOT 0.7 02/06/2017   GFRNONAA 53 (L) 05/16/2018   GFRAA >60 05/16/2018    Lab Results  Component Value Date   WBC 8.6 09/05/2018   NEUTROABS 3.3 09/05/2018   HGB 13.3 09/05/2018   HCT 40.6 09/05/2018   MCV 92.7 09/05/2018   PLT 153 09/05/2018     STUDIES: No results found.  ASSESSMENT: CLL, Rai stage 0  PLAN:    1. CLL: Patient's white blood cell count continues to be within normal limits.  He continues to have no other cytopenias or lymphadenopathy.  Repeat peripheral blood flow cytometry from today is pending at time of dictation.  CLL FISH panel revealed 13 q. deletion.  Patients with this deletion is  a sole anomaly detected by FISH are reported to have the longest survival.  He has no other cytopenias or lymphadenopathy.  No intervention is needed at this time.  Patient does not require bone marrow biopsy.  Continue simple observation.  Return to clinic in 1 year for repeat laboratory work and further evaluation.    I provided 15 minutes of face-to-face video visit time during this encounter, and > 50% was spent counseling as documented under my assessment & plan.   Patient expressed understanding and was in agreement with this plan. He also understands that He can call clinic at any time with any questions, concerns, or complaints.    Lloyd Huger, MD   07/03/2020 8:26 AM

## 2020-07-08 ENCOUNTER — Ambulatory Visit: Payer: Medicare Other | Admitting: Oncology

## 2020-07-08 ENCOUNTER — Other Ambulatory Visit: Payer: Medicare Other

## 2020-07-08 DIAGNOSIS — C9110 Chronic lymphocytic leukemia of B-cell type not having achieved remission: Secondary | ICD-10-CM

## 2020-07-12 ENCOUNTER — Other Ambulatory Visit: Payer: Self-pay | Admitting: *Deleted

## 2020-07-12 ENCOUNTER — Encounter (INDEPENDENT_AMBULATORY_CARE_PROVIDER_SITE_OTHER): Payer: Self-pay

## 2020-07-12 ENCOUNTER — Inpatient Hospital Stay: Payer: Medicare Other | Attending: Oncology | Admitting: Oncology

## 2020-07-12 ENCOUNTER — Encounter: Payer: Self-pay | Admitting: Oncology

## 2020-07-12 ENCOUNTER — Inpatient Hospital Stay: Payer: Medicare Other

## 2020-07-12 VITALS — BP 146/77 | HR 53 | Temp 98.0°F | Resp 20 | Wt 223.0 lb

## 2020-07-12 DIAGNOSIS — C9110 Chronic lymphocytic leukemia of B-cell type not having achieved remission: Secondary | ICD-10-CM | POA: Insufficient documentation

## 2020-07-12 DIAGNOSIS — I4891 Unspecified atrial fibrillation: Secondary | ICD-10-CM | POA: Diagnosis not present

## 2020-07-12 DIAGNOSIS — Z7901 Long term (current) use of anticoagulants: Secondary | ICD-10-CM | POA: Diagnosis not present

## 2020-07-12 DIAGNOSIS — M25562 Pain in left knee: Secondary | ICD-10-CM | POA: Diagnosis not present

## 2020-07-12 DIAGNOSIS — Z87891 Personal history of nicotine dependence: Secondary | ICD-10-CM | POA: Insufficient documentation

## 2020-07-12 LAB — CBC WITH DIFFERENTIAL/PLATELET
Abs Immature Granulocytes: 0.02 10*3/uL (ref 0.00–0.07)
Basophils Absolute: 0.1 10*3/uL (ref 0.0–0.1)
Basophils Relative: 1 %
Eosinophils Absolute: 0.3 10*3/uL (ref 0.0–0.5)
Eosinophils Relative: 4 %
HCT: 42.1 % (ref 39.0–52.0)
Hemoglobin: 13.9 g/dL (ref 13.0–17.0)
Immature Granulocytes: 0 %
Lymphocytes Relative: 46 %
Lymphs Abs: 2.9 10*3/uL (ref 0.7–4.0)
MCH: 31.1 pg (ref 26.0–34.0)
MCHC: 33 g/dL (ref 30.0–36.0)
MCV: 94.2 fL (ref 80.0–100.0)
Monocytes Absolute: 0.6 10*3/uL (ref 0.1–1.0)
Monocytes Relative: 10 %
Neutro Abs: 2.5 10*3/uL (ref 1.7–7.7)
Neutrophils Relative %: 39 %
Platelets: 154 10*3/uL (ref 150–400)
RBC: 4.47 MIL/uL (ref 4.22–5.81)
RDW: 15 % (ref 11.5–15.5)
WBC: 6.3 10*3/uL (ref 4.0–10.5)
nRBC: 0 % (ref 0.0–0.2)

## 2020-07-12 LAB — COMPREHENSIVE METABOLIC PANEL
ALT: 14 U/L (ref 0–44)
AST: 22 U/L (ref 15–41)
Albumin: 4.8 g/dL (ref 3.5–5.0)
Alkaline Phosphatase: 46 U/L (ref 38–126)
Anion gap: 12 (ref 5–15)
BUN: 32 mg/dL — ABNORMAL HIGH (ref 8–23)
CO2: 26 mmol/L (ref 22–32)
Calcium: 9.7 mg/dL (ref 8.9–10.3)
Chloride: 100 mmol/L (ref 98–111)
Creatinine, Ser: 1.36 mg/dL — ABNORMAL HIGH (ref 0.61–1.24)
GFR, Estimated: 54 mL/min — ABNORMAL LOW (ref >60)
Glucose, Bld: 149 mg/dL — ABNORMAL HIGH (ref 70–99)
Potassium: 4.4 mmol/L (ref 3.5–5.1)
Sodium: 138 mmol/L (ref 135–145)
Total Bilirubin: 0.8 mg/dL (ref 0.3–1.2)
Total Protein: 8.2 g/dL — ABNORMAL HIGH (ref 6.5–8.1)

## 2020-07-12 NOTE — Progress Notes (Signed)
Patient denies any concerns today.  

## 2020-07-12 NOTE — Progress Notes (Signed)
[place]  [phone]) [phone] [phone]) [phone]  ID: Colin Gutierrez OB: [birth year]  MR#: [id number]  XHB#:[id number]  Patient Care Team: Baxter Hire, MD as PCP - General (Internal Medicine)  CHIEF COMPLAINT: CLL  INTERVAL HISTORY: Colin Gutierrez presents today to review most recent lab work.  He was last seen in clinic on 07/10/2019.  He continues to feel well and remains asymptomatic. He was recently diagnosed with asymptomatic atrial fibrillation and is followed by Dr. Ubaldo Glassing.   He was placed on anticoagulation for 1 month and will see cardiology back for possible ablation versus cardioversion.  This is scheduled in the next couple weeks.  He is also followed closely by Dr. Marry Guan for left knee pain.  Has plans to complete total knee replacement next year.  In the interim, he denies any fevers, night sweats, or weight loss.  He denies any new lumps or bumps.  He has no neurologic complaints.  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  States he does get fatigued after half a days work.  He denies any chest pain, shortness of breath, cough, or hemoptysis.  He has no nausea, vomiting, constipation, or diarrhea. He has no urinary complaints.  Patient feels at his baseline offers no specific complaints today.  REVIEW OF SYSTEMS:   Review of Systems  Constitutional: Positive for malaise/fatigue. Negative for fever and weight loss.  Respiratory: Negative.  Negative for cough and shortness of breath.   Cardiovascular: Negative.  Negative for chest pain and leg swelling.  Gastrointestinal: Negative.  Negative for abdominal pain and melena.  Genitourinary: Negative.  Negative for dysuria.  Musculoskeletal: Negative.  Negative for back pain.  Skin: Negative.  Negative for rash.  Neurological: Negative.  Negative for focal weakness, weakness and headaches.  Psychiatric/Behavioral: Negative.  The patient is not nervous/anxious.     As per HPI. Otherwise, a  complete review of systems is negative.  PAST MEDICAL HISTORY: Past Medical History:  Diagnosis Date  . Arthritis   . Cancer ([place])    leukemia  . 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 CLL (chronic lymphocytic leukemia) ([place])   . Coronary artery disease   . Diabetes mellitus without complication ([place])   . Duodenitis   . Dysrhythmia    atrial fib  . Erosive gastritis   . Heart murmur   . HH (hiatus hernia)   . History of hiatal hernia   . History of kidney stones   . Hypertension     PAST SURGICAL HISTORY: Past Surgical History:  Procedure Laterality Date  . ASD REPAIR     age 15  . CARDIAC ELECTROPHYSIOLOGY STUDY AND ABLATION     1995  . CORONARY ARTERY BYPASS GRAFT     1963  . ESOPHAGOGASTRODUODENOSCOPY N/A 09/05/2018   Procedure: ESOPHAGOGASTRODUODENOSCOPY (EGD);  Surgeon: Lollie Sails, MD;  Location: [facility] ENDOSCOPY;  Service: Endoscopy;  Laterality: N/A;  . HERNIA REPAIR    . JOINT REPLACEMENT    . TOTAL KNEE ARTHROPLASTY Left 12/02/2014   Procedure: TOTAL KNEE ARTHROPLASTY- computer assisted;  Surgeon: Dereck Leep, MD;  Location: [facility] ORS;  Service: Orthopedics;  Laterality: Left;    FAMILY HISTORY: Reviewed and unchanged. Daughter with stage IV colon cancer otherwise negative and noncontributory.  ADVANCED DIRECTIVES (Y/N):  N  HEALTH MAINTENANCE: Social History   Tobacco Use  . Smoking status: Former Research scientist (life sciences)  . Smokeless tobacco: Former Systems developer    Types: Loss adjuster, chartered  Quit date: 11/17/1968  Vaping Use  . Vaping Use: Never used  Substance Use Topics  . Alcohol use: Yes    Alcohol/week: 2.0 standard drinks    Types: 2 Glasses of wine per week     Colonoscopy:  PAP:  Bone density:  Lipid panel:  No Known Allergies  Current Outpatient Medications  Medication Sig Dispense Refill  . allopurinol (ZYLOPRIM) 100 MG tablet Take 100 mg by mouth daily.    Marland Kitchen aspirin EC 81 MG tablet Take 81 mg by mouth daily.    . cephALEXin (KEFLEX) 500 MG  capsule Reported on 02/08/2016  0  . ferrous sulfate 325 (65 FE) MG tablet Take 1 tablet (325 mg total) by mouth 2 (two) times daily with a meal. 30 tablet 0  . furosemide (LASIX) 20 MG tablet TAKE ONE TABLET BY MOUTH EVERY DAY    . gemfibrozil (LOPID) 600 MG tablet Take 600 mg by mouth 2 (two) times daily before a meal.    . Glucosamine-Chondroitin 250-200 MG TABS Take by mouth.    . hydrochlorothiazide (HYDRODIURIL) 25 MG tablet Take 25 mg by mouth daily. Reported on 02/08/2016    . HYDROcodone-acetaminophen (NORCO/VICODIN) 5-325 MG tablet Take by mouth. Reported on 02/08/2016    . lisinopril (PRINIVIL,ZESTRIL) 2.5 MG tablet     . metFORMIN (GLUCOPHAGE) 1000 MG tablet Take 1,000 mg by mouth 2 (two) times daily with a meal. Reported on 02/08/2016    . metoprolol succinate (TOPROL-XL) 50 MG 24 hr tablet Take 50 mg by mouth daily. Take with or immediately following a meal.    . oxyCODONE (OXY IR/ROXICODONE) 5 MG immediate release tablet Take 1-2 tablets (5-10 mg total) by mouth every 4 (four) hours as needed for breakthrough pain. 80 tablet 0  . sildenafil (REVATIO) 20 MG tablet Reported on 02/08/2016    . silver sulfADIAZINE (SILVADENE) 1 % cream Reported on 02/08/2016  0   No current facility-administered medications for this visit.    OBJECTIVE: There were no vitals filed for this visit.   There is no height or weight on file to calculate BMI.    ECOG FS:0 - Asymptomatic  Physical Exam Constitutional:      General: Vital signs are normal.     Appearance: Normal appearance.  HENT:     Head: Normocephalic and atraumatic.  Eyes:     Pupils: Pupils are equal, round, and reactive to light.  Cardiovascular:     Rate and Rhythm: Normal rate and regular rhythm.     Heart sounds: Normal heart sounds. No murmur heard.   Pulmonary:     Effort: Pulmonary effort is normal.     Breath sounds: Normal breath sounds. No wheezing.  Abdominal:     General: Bowel sounds are normal. There is no  distension.     Palpations: Abdomen is soft.     Tenderness: There is no abdominal tenderness.  Musculoskeletal:        General: No edema. Normal range of motion.     Cervical back: Normal range of motion.  Skin:    General: Skin is warm and dry.     Findings: No rash.  Neurological:     Mental Status: He is alert and oriented to person, place, and time.  Psychiatric:        Judgment: Judgment normal.     LAB RESULTS:  Lab Results  Component Value Date   NA 137 02/06/2017   K 3.8 02/06/2017   CL 101  02/06/2017   CO2 24 02/06/2017   GLUCOSE 146 (H) 02/06/2017   BUN 29 (H) 02/06/2017   CREATININE 1.29 (H) 05/16/2018   CALCIUM 9.6 02/06/2017   PROT 8.0 02/06/2017   ALBUMIN 4.5 02/06/2017   AST 27 02/06/2017   ALT 13 (L) 02/06/2017   ALKPHOS 48 02/06/2017   BILITOT 0.7 02/06/2017   GFRNONAA 53 (L) 05/16/2018   GFRAA >60 05/16/2018    Lab Results  Component Value Date   WBC 8.6 09/05/2018   NEUTROABS 3.3 09/05/2018   HGB 13.3 09/05/2018   HCT 40.6 09/05/2018   MCV 92.7 09/05/2018   PLT 153 09/05/2018     STUDIES: No results found.  ASSESSMENT: CLL, Rai stage 0  PLAN:    CLL:  -Initially discovered back in 2012.  -Initial flow cytometry-CD5+, CD 23+, monoclonal B cell population is detected with lambda light chain restriction representing 52% of leukocytes -CT abdomen/pelvis/chest 2012-no evidence of lymphadenopathy or splenomegaly. -Labs from 07/12/20 were unremarkable except for an elevated creatinine of 1.36. White count remains normal. -Baseline creatinine is between 1.11-1.36. -No cytopenias. -Flow cytometry pending. -No intervention needed at this time. -We will continue to monitor him.   Atrial fibrillation: -Followed by Dr. Ubaldo Glassing. -Currently on anticoagulation with Eliquis. -Plan to have cardioversion versus ablation in the next month or so.  Left knee pain: -Followed by Dr. Marry Guan. -Plans for total knee replacement next  year.  Disposition: -RTC in 1 year for repeat lab and evaluation.  Greater than 50% was spent in counseling and coordination of care with this patient including but not limited to discussion of the relevant topics above (See A&P) including, but not limited to diagnosis and management of acute and chronic medical conditions.   Patient expressed understanding and was in agreement with this plan. He also understands that He can call clinic at any time with any questions, concerns, or complaints.    Jacquelin Hawking, NP   07/12/2020 9:54 AM

## 2020-07-15 LAB — COMP PANEL: LEUKEMIA/LYMPHOMA: Immunophenotypic Profile: 20

## 2020-07-19 LAB — FISH HES LEUKEMIA, 4Q12 REA

## 2020-09-21 ENCOUNTER — Other Ambulatory Visit
Admission: RE | Admit: 2020-09-21 | Discharge: 2020-09-21 | Disposition: A | Discharge status: Home / Self Care | Payer: Medicare Other | Source: Ambulatory Visit | Attending: Cardiology | Admitting: Cardiology

## 2020-09-21 ENCOUNTER — Other Ambulatory Visit: Payer: Self-pay

## 2020-09-21 DIAGNOSIS — Z20822 Contact with and (suspected) exposure to covid-19: Secondary | ICD-10-CM | POA: Insufficient documentation

## 2020-09-21 DIAGNOSIS — Z01812 Encounter for preprocedural laboratory examination: Secondary | ICD-10-CM | POA: Diagnosis present

## 2020-09-21 LAB — SARS CORONAVIRUS 2 (TAT 6-24 HRS): SARS Coronavirus 2: NEGATIVE

## 2020-09-22 ENCOUNTER — Other Ambulatory Visit: Payer: Self-pay | Admitting: Cardiology

## 2020-09-22 MED ORDER — SODIUM CHLORIDE 0.9% FLUSH: 3.0000 mL | Freq: Two times a day (BID) | INTRAVENOUS | Status: AC

## 2020-09-23 ENCOUNTER — Encounter: Payer: Self-pay | Admitting: Cardiology

## 2020-09-23 ENCOUNTER — Other Ambulatory Visit: Payer: Self-pay

## 2020-09-23 ENCOUNTER — Encounter
Admission: RE | Disposition: A | Discharge status: Home / Self Care | Payer: Self-pay | Source: Home / Self Care | Attending: Cardiology

## 2020-09-23 ENCOUNTER — Ambulatory Visit
Admission: RE | Admit: 2020-09-23 | Discharge: 2020-09-23 | Disposition: A | Discharge status: Home / Self Care | Payer: Medicare Other | Attending: Cardiology | Admitting: Cardiology

## 2020-09-23 DIAGNOSIS — Z79899 Other long term (current) drug therapy: Secondary | ICD-10-CM | POA: Diagnosis not present

## 2020-09-23 DIAGNOSIS — Z87891 Personal history of nicotine dependence: Secondary | ICD-10-CM | POA: Insufficient documentation

## 2020-09-23 DIAGNOSIS — Z7901 Long term (current) use of anticoagulants: Secondary | ICD-10-CM | POA: Insufficient documentation

## 2020-09-23 DIAGNOSIS — E119 Type 2 diabetes mellitus without complications: Secondary | ICD-10-CM | POA: Insufficient documentation

## 2020-09-23 DIAGNOSIS — I10 Essential (primary) hypertension: Secondary | ICD-10-CM | POA: Insufficient documentation

## 2020-09-23 DIAGNOSIS — I4819 Other persistent atrial fibrillation: Secondary | ICD-10-CM | POA: Insufficient documentation

## 2020-09-23 DIAGNOSIS — R0602 Shortness of breath: Secondary | ICD-10-CM | POA: Insufficient documentation

## 2020-09-23 DIAGNOSIS — C9110 Chronic lymphocytic leukemia of B-cell type not having achieved remission: Secondary | ICD-10-CM | POA: Diagnosis not present

## 2020-09-23 DIAGNOSIS — E7800 Pure hypercholesterolemia, unspecified: Secondary | ICD-10-CM | POA: Insufficient documentation

## 2020-09-23 DIAGNOSIS — G4733 Obstructive sleep apnea (adult) (pediatric): Secondary | ICD-10-CM | POA: Insufficient documentation

## 2020-09-23 DIAGNOSIS — Z7984 Long term (current) use of oral hypoglycemic drugs: Secondary | ICD-10-CM | POA: Insufficient documentation

## 2020-09-23 DIAGNOSIS — I272 Pulmonary hypertension, unspecified: Secondary | ICD-10-CM | POA: Insufficient documentation

## 2020-09-23 HISTORY — DX: Sleep apnea, unspecified: G47.30

## 2020-09-23 HISTORY — PX: RIGHT HEART CATH: CATH118263

## 2020-09-23 LAB — GLUCOSE, CAPILLARY: Glucose-Capillary: 140 mg/dL — ABNORMAL HIGH (ref 70–99)

## 2020-09-23 SURGERY — RIGHT HEART CATH
Anesthesia: Moderate Sedation | Laterality: Right

## 2020-09-23 MED ORDER — LIDOCAINE HCL (PF) 1 % IJ SOLN
INTRAMUSCULAR | Status: DC | PRN
  Administered 2020-09-23: 8 mL via SUBCUTANEOUS

## 2020-09-23 MED ORDER — SODIUM CHLORIDE 0.9 % IV SOLN: INTRAVENOUS | Status: DC

## 2020-09-23 MED ORDER — MIDAZOLAM HCL 2 MG/2ML IJ SOLN
INTRAMUSCULAR | Status: DC | PRN
  Administered 2020-09-23: 1 mg via INTRAVENOUS

## 2020-09-23 MED ORDER — SODIUM CHLORIDE 0.9% FLUSH: 3.0000 mL | INTRAVENOUS | Status: DC | PRN

## 2020-09-23 MED ORDER — SODIUM CHLORIDE 0.9 % IV SOLN: 250.0000 mL | INTRAVENOUS | Status: DC | PRN

## 2020-09-23 MED ORDER — FENTANYL CITRATE (PF) 100 MCG/2ML IJ SOLN
INTRAMUSCULAR | Status: DC | PRN
  Administered 2020-09-23: 25 ug via INTRAVENOUS

## 2020-09-23 MED ORDER — ASPIRIN 81 MG PO CHEW: 81.0000 mg | CHEWABLE_TABLET | ORAL | Status: DC

## 2020-09-23 MED ORDER — HEPARIN (PORCINE) IN NACL 1000-0.9 UT/500ML-% IV SOLN
INTRAVENOUS | Status: DC | PRN
  Administered 2020-09-23 (×2): 500 mL

## 2020-09-23 MED ORDER — ACETAMINOPHEN 325 MG PO TABS: 650.0000 mg | ORAL_TABLET | ORAL | Status: DC | PRN

## 2020-09-23 MED ORDER — HYDRALAZINE HCL 20 MG/ML IJ SOLN: 10.0000 mg | INTRAMUSCULAR | Status: DC | PRN

## 2020-09-23 MED ORDER — HEPARIN (PORCINE) IN NACL 1000-0.9 UT/500ML-% IV SOLN
INTRAVENOUS | Status: AC
  Filled 2020-09-23: qty 1000

## 2020-09-23 MED ORDER — SODIUM CHLORIDE 0.9% FLUSH: 3.0000 mL | Freq: Two times a day (BID) | INTRAVENOUS | Status: DC

## 2020-09-23 MED ORDER — LABETALOL HCL 5 MG/ML IV SOLN: 10.0000 mg | INTRAVENOUS | Status: DC | PRN

## 2020-09-23 MED ORDER — ONDANSETRON HCL 4 MG/2ML IJ SOLN: 4.0000 mg | Freq: Four times a day (QID) | INTRAMUSCULAR | Status: DC | PRN

## 2020-09-23 MED ORDER — MIDAZOLAM HCL 2 MG/2ML IJ SOLN
INTRAMUSCULAR | Status: AC
  Filled 2020-09-23: qty 2

## 2020-09-23 MED ORDER — FENTANYL CITRATE (PF) 100 MCG/2ML IJ SOLN
INTRAMUSCULAR | Status: AC
  Filled 2020-09-23: qty 2

## 2020-09-23 SURGICAL SUPPLY — 7 items
CATH SWAN GANZ 7F STRAIGHT (CATHETERS) ×3 IMPLANT
GUIDEWIRE EMER 3M J .025X150CM (WIRE) ×3 IMPLANT
KIT SYRINGE INJ CVI SPIKEX1 (MISCELLANEOUS) ×3 IMPLANT
NEEDLE PERC 18GX7CM (NEEDLE) ×3 IMPLANT
PACK CARDIAC CATH (CUSTOM PROCEDURE TRAY) ×3 IMPLANT
SET ATX SIMPLICITY (MISCELLANEOUS) ×3 IMPLANT
SHEATH AVANTI 7FRX11 (SHEATH) ×3 IMPLANT

## 2020-09-23 NOTE — Discharge Instructions (Signed)
Heart Failure, Diagnosis  Heart failure is a condition in which the heart has trouble pumping blood. This may mean that the heart cannot pump enough blood out to the body or that the heart does not fill up with enough blood. For some people with heart failure, fluid may back up into the lungs. There may also be swelling (edema) in the lower legs. Heart failure is usually a long-term (chronic) condition. It is important for you to take good care of yourself and follow the treatment plan from your health care provider. What are the causes? This condition may be caused by:  High blood pressure (hypertension). Hypertension causes the heart muscle to work harder than normal.  Coronary artery disease, or CAD. CAD is the buildup of cholesterol and fat (plaque) in the arteries of the heart.  Heart attack, also called myocardial infarction. This injures the heart muscle, making it hard for the heart to pump blood.  Abnormal heart valves. The valves do not open and close properly, forcing the heart to pump harder to keep the blood flowing.  Heart muscle disease, inflammation, or infection (cardiomyopathy or myocarditis). This is damage to the heart muscle. It can increase the risk of heart failure.  Lung disease. The heart works harder when the lungs are not healthy. What increases the risk? The risk of heart failure increases as a person ages. This condition is also more likely to develop in people who:  Are obese.  Are male.  Use tobacco or nicotine products.  Abuse alcohol or drugs.  Have taken medicines that can damage the heart, such as chemotherapy drugs.  Have any of these conditions: ? Diabetes. ? Abnormal heart rhythms. ? Thyroid problems. ? Low blood counts (anemia). ? Chronic kidney disease.  Have a family history of heart failure. What are the signs or symptoms? Symptoms of this condition include:  Shortness of breath with activity, such as when climbing stairs.  A cough  that does not go away.  Swelling of the feet, ankles, legs, or abdomen.  Losing or gaining weight for no reason.  Trouble breathing when lying flat.  Waking from sleep because of the need to sit up and get more air.  Rapid heartbeat.  Tiredness (fatigue) and loss of energy.  Feeling light-headed, dizzy, or close to fainting.  Nausea or loss of appetite.  Waking up more often during the night to urinate (nocturia).  Confusion. How is this diagnosed? This condition is diagnosed based on:  Your medical history, symptoms, and a physical exam.  Diagnostic tests, which may include: ? Echocardiogram. ? Electrocardiogram (ECG). ? Chest X-ray. ? Blood tests. ? Exercise stress test. ? Cardiac MRI. ? Cardiac catheterization and angiogram. ? Radionuclide scans. How is this treated? Treatment for this condition is aimed at managing the symptoms of heart failure. Medicines Treatment may include medicines that:  Help lower blood pressure by relaxing (dilating) the blood vessels. These medicines are called ACE inhibitors (angiotensin-converting enzyme), ARBs (angiotensin receptor blockers), or vasodilators.  Cause the kidneys to remove salt and water from the blood through urination (diuretics).  Improve heart muscle strength and prevent the heart from beating too fast (beta blockers).  Increase the force of the heartbeat (digoxin).  Lower heart rates. Certain diabetes medicines (SGLT-2 inhibitors) may also be used in treatment. Healthy behavior changes Treatment may also include making healthy lifestyle changes, such as:  Reaching and staying at a healthy weight.  Not using tobacco or nicotine products.  Eating heart-healthy foods.    Limiting or avoiding alcohol.  Stopping the use of illegal drugs.  Being physically active.  Participating in a cardiac rehabilitation program, which is a treatment program to improve your health and well-being through exercise training,  education, and counseling. Other treatments Other treatments may include:  Procedures to open blocked arteries or repair damaged valves.  Placing a pacemaker to improve heart function (cardiac resynchronization therapy).  Placing a device to treat serious abnormal heart rhythms (implantable cardioverter defibrillator, or ICD).  Placing a device to improve the pumping ability of the heart (left ventricular assist device, or LVAD).  Receiving a healthy heart from a donor (heart transplant). This is done when other treatments have not helped. Follow these instructions at home:  Manage other health conditions as told by your health care provider. These may include hypertension, diabetes, thyroid disease, or abnormal heart rhythms.  Get ongoing education and support as needed. Learn as much as you can about heart failure.  Keep all follow-up visits. This is important. Summary  Heart failure is a condition in which the heart has trouble pumping blood.  This condition is commonly caused by high blood pressure and other diseases of the heart and lungs.  Symptoms of this condition include shortness of breath, tiredness (fatigue), nausea, and swelling of the feet, ankles, legs, or abdomen.  Treatments for this condition may include medicines, lifestyle changes, and surgery.  Manage other health conditions as told by your health care provider. This information is not intended to replace advice given to you by your health care provider. Make sure you discuss any questions you have with your health care provider. Document Revised: 02/07/2020 Document Reviewed: 02/07/2020 Elsevier Patient Education  2021 [employer]. Moderate Conscious Sedation, Adult, Care After This sheet gives you information about how to care for yourself after your procedure. Your health care provider may also give you more specific instructions. If you have problems or questions, contact your health care provider. What  can I expect after the procedure? After the procedure, it is common to have:  Sleepiness for several hours.  Impaired judgment for several hours.  Difficulty with balance.  Vomiting if you eat too soon. Follow these instructions at home: For the time period you were told by your health care provider:  Rest.  Do not participate in activities where you could fall or become injured.  Do not drive or use machinery.  Do not drink alcohol.  Do not take sleeping pills or medicines that cause drowsiness.  Do not make important decisions or sign legal documents.  Do not take care of children on your own.      Eating and drinking  Follow the diet recommended by your health care provider.  Drink enough fluid to keep your urine pale yellow.  If you vomit: ? Drink water, juice, or soup when you can drink without vomiting. ? Make sure you have little or no nausea before eating solid foods.   General instructions  Take over-the-counter and prescription medicines only as told by your health care provider.  Have a responsible adult stay with you for the time you are told. It is important to have someone help care for you until you are awake and alert.  Do not smoke.  Keep all follow-up visits as told by your health care provider. This is important. Contact a health care provider if:  You are still sleepy or having trouble with balance after 24 hours.  You feel light-headed.  You keep feeling nauseous or  you keep vomiting.  You develop a rash.  You have a fever.  You have redness or swelling around the IV site. Get help right away if:  You have trouble breathing.  You have new-onset confusion at home. Summary  After the procedure, it is common to feel sleepy, have impaired judgment, or feel nauseous if you eat too soon.  Rest after you get home. Know the things you should not do after the procedure.  Follow the diet recommended by your health care provider and  drink enough fluid to keep your urine pale yellow.  Get help right away if you have trouble breathing or new-onset confusion at home. This information is not intended to replace advice given to you by your health care provider. Make sure you discuss any questions you have with your health care provider. Document Revised: 11/14/2019 Document Reviewed: 06/12/2019 Elsevier Patient Education  2021 [employer].

## 2020-09-23 NOTE — H&P (Signed)
Chief Complaint: Chief Complaint  Patient presents with  . Atrial Fibrillation  persistent  . 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 He denies any significant shortness of breath. Echocardiogram on 05/09/2018 revealed normal left ventricular systolic function with moderate RV systolic dysfunction. Severe right atrial enlargement and severe tricuspid regurgitation with elevated pulmonary pressures noted. Home sleep study revealed mild obstructive sleep apnea. Denies associated chest pain or chest pressure. Also denies evidence of lower extremity swelling, orthopnea, or PND. Remains on his CPAP machine every night and is doing well with this. He denies dizziness, lightheadedness, syncopal/presyncopal episodes. He is very compliant with his CPAP and gets a lot of improvement with it. Recent Holter monitor revealed recurrent atrial fibrillation which was sustained with variable ventricular response. He has had echoes in the past showing elevated right-sided pressures. More recent echo showed slightly increased RV systolic pressure to 55 mmHg. He is on apixaban 5 mg twice daily. He had a negative CT for pulmonary embolus. He is only minimally symptomatic. He is referred back for consideration for right heart cath. Risks and benefits were discussed.  Past Medical and Surgical History  Past Medical History Past Medical History:  Diagnosis Date  . Atrial fibrillation (CMS-HCC)  . CLL (chronic lymphocytic leukemia) (CMS-HCC)  . Colon polyp  . Diverticulosis 10/06/2014  . Duodenitis  10/06/2014  . Erosive gastritis 10/06/2014  . Essential hypertension, benign  . Gout  . HH (hiatus hernia) 10/06/2014  . History of kidney stones  . Hyperplastic colon polyp 10/06/2014  . Obesity  . 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 Tubular adenoma of colon 10/06/2014   Past Surgical History He has a past surgical history that includes Hernia repair (Left); Hernia repair; ASD repair; RAI ablation for tachyarrhythmias (1995); Colonoscopy (08/19/2009); Colonoscopy (10/06/2014); egd (10/06/2014); Left Total Knee Arthroplasty (12/02/14); and egd (09/05/2018).   Medications and Allergies  Current Medications  Current Outpatient Medications  Medication Sig Dispense Refill  . allopurinoL (ZYLOPRIM) 300 MG tablet TAKE ONE TABLET EVERY DAY 90 tablet 1  . amoxicillin (AMOXIL) 500 MG capsule  . apixaban (ELIQUIS) 5 mg tablet Take 1 tablet (5 mg total) by mouth every 12 (twelve) hours 60 tablet 11  . colchicine (COLCRYS) 0.6 mg tablet Take 1 tablet (0.6 mg total) by mouth 2 (two) times daily Take 2 tablets once, then 1 tablet twice daily if needed for flare up 60 tablet 2  . FUROsemide (LASIX) 20 MG tablet TAKE 1 TABLET BY MOUTH DAILY 90 tablet 1  . gemfibroziL (LOPID) 600 mg tablet TAKE ONE TABLET TWICE DAILY BEFORE MEALS 180 tablet 1  . lisinopriL (ZESTRIL) 2.5 MG tablet TAKE 1 TABLET BY MOUTH DAILY 30 tablet 11  . metFORMIN (GLUCOPHAGE) 1000 MG tablet TAKE ONE TABLET TWICE DAILY WITH MEALS 180 tablet 1  . metoprolol succinate (TOPROL-XL) 50 MG XL tablet TAKE 1 TABLET BY MOUTH DAILY 90 tablet 3  . pantoprazole (PROTONIX) 40 MG DR tablet TAKE 1 TABLET BY MOUTH DAILY 30 MINUTES BEFORE A MEAL TWICE DAILY FOR1 MONTH THEN REDUCE TO DAILY 90 tablet 1  . sildenafil (  REVATIO) 20 mg tablet TAKE 3-5 TABLETS BY MOUTH DAILY AS NEEDED 100 tablet 3   No current facility-administered medications for this visit.   Allergies: Patient has no known allergies.  Social and Family History  Social  History reports that he quit smoking about 52 years ago. His smoking use included cigarettes. He has a 2.50 pack-year smoking history. He has quit using smokeless tobacco. His smokeless tobacco use included chew. He reports that he does not drink alcohol and does not use drugs.  Family History Family History  Problem Relation Age of Onset  . High blood pressure (Hypertension) Mother  . Lung disease Father  . Emphysema Father  . Colon cancer Daughter   Review of Systems  Review of Systems  Constitutional: Negative for chills, diaphoresis, fever, malaise/fatigue and weight loss.  HENT: Negative for congestion, ear discharge, hearing loss and tinnitus.  Eyes: Negative for blurred vision.  Respiratory: Negative for cough, hemoptysis, sputum production, shortness of breath and wheezing.  Cardiovascular: Negative for chest pain, palpitations, orthopnea, claudication, leg swelling and PND.  Gastrointestinal: Negative for abdominal pain, blood in stool, constipation, diarrhea, heartburn, melena, nausea and vomiting.  Genitourinary: Negative for dysuria, frequency, hematuria and urgency.  Musculoskeletal: Negative for back pain, falls, joint pain and myalgias.  Skin: Negative for itching and rash.  Neurological: Negative for dizziness, tingling, focal weakness, loss of consciousness, weakness and headaches.  Endo/Heme/Allergies: Negative for polydipsia. Does not bruise/bleed easily.  Psychiatric/Behavioral: Negative for depression, memory loss and substance abuse. The patient is not nervous/anxious.   Physical Examination   Vitals:BP 138/68  Pulse 59  Ht 185.4 cm (6\' 1" )  Wt (!) 102.5 kg (226 lb)  SpO2 97%  BMI 29.82 kg/m  Ht:185.4 cm (6\' 1" ) Wt:(!) 102.5 kg (226 lb) UXN:ATFT surface area is 2.3 meters squared. Body mass index is 29.82 kg/m.  Wt Readings from Last 3 Encounters:  09/14/20 (!) 102.5 kg (226 lb)  09/02/20 (!) 102.5 kg (226 lb)  08/19/20 (!) 103 kg (227 lb)   BP  Readings from Last 3 Encounters:  09/14/20 138/68  09/02/20 131/56  08/19/20 (!) 140/70   General appearance appears in no acute distress  Head Mouth and Eye exam Normocephalic, without obvious abnormality, atraumatic Dentition is good Eyes appear anicteric   LUNGS Breath Sounds: Normal Percussion: Normal  CARDIOVASCULAR JVP CV wave: no HJR: no Elevation at 90 degrees: None Carotid Pulse: normal pulsation bilaterally Bruit: None Apex: apical impulse normal  Auscultation Rhythm: normal sinus rhythm S1: normal S2: normal Clicks: no Rub: no Murmurs: 2/6 medium pitched mid systolic musical at lower left sternal border  Gallop: None  EXTREMITIES Clubbing: no Edema: trace to 1+ bilateral pedal edema Pulses: peripheral pulses symmetrical Femoral Bruits: no Amputation: no SKIN Rash: no Cyanosis: no Embolic phemonenon: no Bruising: no NEURO Alert and Oriented to person, place and time: yes Non focal: yes  PSYCH: Pt appears to have normal affect  LABS REVIEWED Last 3 CBC results: Lab Results  Component Value Date  WBC 5.4 01/21/2020  WBC 7.6 05/14/2019  WBC 6.5 01/13/2019   Lab Results  Component Value Date  HGB 13.6 (L) 01/21/2020  HGB 13.6 (L) 05/14/2019  HGB 13.0 (L) 01/13/2019   Lab Results  Component Value Date  HCT 41.7 01/21/2020  HCT 41.2 05/14/2019  HCT 39.9 (L) 01/13/2019   Lab Results  Component Value Date  PLT 118 (L) 01/21/2020  PLT 123 (L) 05/14/2019  PLT 108 (L) 01/13/2019   Lab Results  Component  Value Date  CREATININE 1.3 05/26/2020  BUN 35 (H) 05/26/2020  NA 141 05/26/2020  K 4.1 05/26/2020  CL 104 05/26/2020  CO2 31.0 05/26/2020   Lab Results  Component Value Date  HGBA1C 6.6 (H) 05/26/2020   Lab Results  Component Value Date  HDL 34.0 01/21/2020  HDL 37.5 06/14/2018  HDL 33.1 05/30/2017   Lab Results  Component Value Date  LDLCALC 60 01/21/2020  LDLCALC 55 06/14/2018  LDLCALC 71 05/30/2017   Lab Results   Component Value Date  TRIG 162 01/21/2020  TRIG 113 06/14/2018  TRIG 99 05/30/2017   Lab Results  Component Value Date  ALT 10 01/21/2020  AST 18 01/21/2020  ALKPHOS 51 01/21/2020   Lab Results  Component Value Date  TSH 2.612 01/15/2017   Assessment and Plan   75 y.o. male with  ICD-10-CM  1. Nonrheumatic tricuspid valve regurgitation-asymptomatic at present. Attempting to reduce your right-sided pressures with CPAP. Patient is very compliant with this. Most recent echo showed estimated RV systolic pressure of 55 mmHg. We will proceed with a right heart cath to better evaluate. I36.1  2. Pulmonary hypertension, moderate to severe (CMS-HCC)-as per above. On sildenafil. Somewhat more elevated. Will refer to pulmonary for further assessment recommendations. I27.20  3. Atrial fibrillation, unspecified type (CMS-HCC)-current A. fib. Status post flutter ablation in 1990s. Will need to consider A. fib ablation. Will refer to electrophysiology after an echocardiogram. Will treat with apixaban at 5 mg twice daily. I48.91  4. Obstructive sleep apnea weight loss and CPAP use G47.33  5. Essential hypertension, benign-blood pressure controlled with metoprolol succinate at 50 mg daily. We will continue and follow. I10  6. Type 2 diabetes with nephropathy (CMS-HCC) E11.21  7. CLL (chronic lymphocytic leukemia) (CMS-HCC)-being followed by oncology. C91.10  8. History of atrial septal defect repair done as an child. Z87.74  9. Hypercholesterolemia E78.00   Return in about 3 months (around 12/12/2020).  These notes generated with voice recognition software. I apologize for typographical errors.  Sydnee Levans, MD   Pt seen and examined. No change from above.

## 2023-08-14 DIAGNOSIS — G4733 Obstructive sleep apnea (adult) (pediatric): Secondary | ICD-10-CM | POA: Diagnosis not present

## 2023-09-04 DIAGNOSIS — E113393 Type 2 diabetes mellitus with moderate nonproliferative diabetic retinopathy without macular edema, bilateral: Secondary | ICD-10-CM | POA: Diagnosis not present

## 2023-09-25 DIAGNOSIS — E1121 Type 2 diabetes mellitus with diabetic nephropathy: Secondary | ICD-10-CM | POA: Diagnosis not present

## 2023-10-02 DIAGNOSIS — C9110 Chronic lymphocytic leukemia of B-cell type not having achieved remission: Secondary | ICD-10-CM | POA: Diagnosis not present

## 2023-10-02 DIAGNOSIS — E1121 Type 2 diabetes mellitus with diabetic nephropathy: Secondary | ICD-10-CM | POA: Diagnosis not present

## 2023-10-02 DIAGNOSIS — E7800 Pure hypercholesterolemia, unspecified: Secondary | ICD-10-CM | POA: Diagnosis not present

## 2023-10-02 DIAGNOSIS — Z0000 Encounter for general adult medical examination without abnormal findings: Secondary | ICD-10-CM | POA: Diagnosis not present

## 2023-10-02 DIAGNOSIS — I10 Essential (primary) hypertension: Secondary | ICD-10-CM | POA: Diagnosis not present

## 2023-10-02 DIAGNOSIS — K7460 Unspecified cirrhosis of liver: Secondary | ICD-10-CM | POA: Diagnosis not present

## 2023-10-02 DIAGNOSIS — I4819 Other persistent atrial fibrillation: Secondary | ICD-10-CM | POA: Diagnosis not present

## 2023-11-29 DIAGNOSIS — I361 Nonrheumatic tricuspid (valve) insufficiency: Secondary | ICD-10-CM | POA: Diagnosis not present

## 2023-11-29 DIAGNOSIS — I4819 Other persistent atrial fibrillation: Secondary | ICD-10-CM | POA: Diagnosis not present

## 2023-11-29 DIAGNOSIS — E7800 Pure hypercholesterolemia, unspecified: Secondary | ICD-10-CM | POA: Diagnosis not present

## 2023-11-29 DIAGNOSIS — E1121 Type 2 diabetes mellitus with diabetic nephropathy: Secondary | ICD-10-CM | POA: Diagnosis not present

## 2023-11-29 DIAGNOSIS — G4733 Obstructive sleep apnea (adult) (pediatric): Secondary | ICD-10-CM | POA: Diagnosis not present

## 2023-11-29 DIAGNOSIS — I10 Essential (primary) hypertension: Secondary | ICD-10-CM | POA: Diagnosis not present

## 2024-01-08 DIAGNOSIS — G4733 Obstructive sleep apnea (adult) (pediatric): Secondary | ICD-10-CM | POA: Diagnosis not present

## 2024-01-10 DIAGNOSIS — R252 Cramp and spasm: Secondary | ICD-10-CM | POA: Diagnosis not present

## 2024-01-29 DIAGNOSIS — I272 Pulmonary hypertension, unspecified: Secondary | ICD-10-CM | POA: Diagnosis not present

## 2024-01-29 DIAGNOSIS — G4733 Obstructive sleep apnea (adult) (pediatric): Secondary | ICD-10-CM | POA: Diagnosis not present

## 2024-01-29 DIAGNOSIS — I5189 Other ill-defined heart diseases: Secondary | ICD-10-CM | POA: Diagnosis not present

## 2024-03-20 DIAGNOSIS — E113393 Type 2 diabetes mellitus with moderate nonproliferative diabetic retinopathy without macular edema, bilateral: Secondary | ICD-10-CM | POA: Diagnosis not present

## 2024-03-25 DIAGNOSIS — I10 Essential (primary) hypertension: Secondary | ICD-10-CM | POA: Diagnosis not present

## 2024-03-25 DIAGNOSIS — E1121 Type 2 diabetes mellitus with diabetic nephropathy: Secondary | ICD-10-CM | POA: Diagnosis not present

## 2024-04-01 DIAGNOSIS — Z1331 Encounter for screening for depression: Secondary | ICD-10-CM | POA: Diagnosis not present

## 2024-04-01 DIAGNOSIS — K7460 Unspecified cirrhosis of liver: Secondary | ICD-10-CM | POA: Diagnosis not present

## 2024-04-01 DIAGNOSIS — G4733 Obstructive sleep apnea (adult) (pediatric): Secondary | ICD-10-CM | POA: Diagnosis not present

## 2024-04-01 DIAGNOSIS — E1121 Type 2 diabetes mellitus with diabetic nephropathy: Secondary | ICD-10-CM | POA: Diagnosis not present

## 2024-04-01 DIAGNOSIS — K4090 Unilateral inguinal hernia, without obstruction or gangrene, not specified as recurrent: Secondary | ICD-10-CM | POA: Diagnosis not present

## 2024-04-01 DIAGNOSIS — Z0001 Encounter for general adult medical examination with abnormal findings: Secondary | ICD-10-CM | POA: Diagnosis not present

## 2024-04-01 DIAGNOSIS — I4819 Other persistent atrial fibrillation: Secondary | ICD-10-CM | POA: Diagnosis not present

## 2024-04-01 DIAGNOSIS — Z23 Encounter for immunization: Secondary | ICD-10-CM | POA: Diagnosis not present

## 2024-04-01 DIAGNOSIS — I10 Essential (primary) hypertension: Secondary | ICD-10-CM | POA: Diagnosis not present

## 2024-04-01 DIAGNOSIS — E7800 Pure hypercholesterolemia, unspecified: Secondary | ICD-10-CM | POA: Diagnosis not present

## 2024-04-08 DIAGNOSIS — Z86018 Personal history of other benign neoplasm: Secondary | ICD-10-CM | POA: Diagnosis not present

## 2024-04-08 DIAGNOSIS — L570 Actinic keratosis: Secondary | ICD-10-CM | POA: Diagnosis not present

## 2024-04-08 DIAGNOSIS — L578 Other skin changes due to chronic exposure to nonionizing radiation: Secondary | ICD-10-CM | POA: Diagnosis not present

## 2024-04-08 DIAGNOSIS — Z872 Personal history of diseases of the skin and subcutaneous tissue: Secondary | ICD-10-CM | POA: Diagnosis not present

## 2024-04-09 DIAGNOSIS — G4733 Obstructive sleep apnea (adult) (pediatric): Secondary | ICD-10-CM | POA: Diagnosis not present

## 2024-05-29 DIAGNOSIS — E7800 Pure hypercholesterolemia, unspecified: Secondary | ICD-10-CM | POA: Diagnosis not present

## 2024-05-29 DIAGNOSIS — E1121 Type 2 diabetes mellitus with diabetic nephropathy: Secondary | ICD-10-CM | POA: Diagnosis not present

## 2024-05-29 DIAGNOSIS — I4819 Other persistent atrial fibrillation: Secondary | ICD-10-CM | POA: Diagnosis not present

## 2024-05-29 DIAGNOSIS — I10 Essential (primary) hypertension: Secondary | ICD-10-CM | POA: Diagnosis not present

## 2024-05-29 DIAGNOSIS — I482 Chronic atrial fibrillation, unspecified: Secondary | ICD-10-CM | POA: Diagnosis not present

## 2024-05-29 DIAGNOSIS — I340 Nonrheumatic mitral (valve) insufficiency: Secondary | ICD-10-CM | POA: Diagnosis not present

## 2024-05-29 DIAGNOSIS — I071 Rheumatic tricuspid insufficiency: Secondary | ICD-10-CM | POA: Diagnosis not present

## 2024-05-29 DIAGNOSIS — G4733 Obstructive sleep apnea (adult) (pediatric): Secondary | ICD-10-CM | POA: Diagnosis not present

## 2024-06-20 NOTE — Progress Notes (Signed)
 KEYVIN RISON                                          MRN: [id number]   06/20/2024   The [facility] Quality Team Specialist reviewed this patient medical record for the purposes of chart review for care gap closure. The following were reviewed: chart review for care gap closure-controlling blood pressure.    [facility] Quality Team
